# Patient Record
Sex: Male | Born: 1945 | Race: White | Hispanic: No | Marital: Married | Smoking: Former smoker
Health system: Southern US, Community
[De-identification: ages and names within clinical notes are randomized; demographics above are authoritative.]

## PROBLEM LIST (undated history)

## (undated) DIAGNOSIS — C61 Malignant neoplasm of prostate: Secondary | ICD-10-CM

## (undated) DIAGNOSIS — R7303 Prediabetes: Secondary | ICD-10-CM

## (undated) HISTORY — PX: SKIN CANCER EXCISION: SHX779

## (undated) HISTORY — PX: COLONOSCOPY: SHX174

## (undated) HISTORY — PX: FLEXIBLE SIGMOIDOSCOPY: SHX1649

## (undated) HISTORY — DX: Malignant neoplasm of prostate: C61

## (undated) HISTORY — PX: OTHER SURGICAL HISTORY: SHX169

---

## 2010-08-28 ENCOUNTER — Ambulatory Visit: Payer: Self-pay | Admitting: Gastroenterology

## 2011-12-16 DIAGNOSIS — N400 Enlarged prostate without lower urinary tract symptoms: Secondary | ICD-10-CM | POA: Insufficient documentation

## 2011-12-16 DIAGNOSIS — R972 Elevated prostate specific antigen [PSA]: Secondary | ICD-10-CM | POA: Insufficient documentation

## 2012-01-06 ENCOUNTER — Ambulatory Visit: Payer: Self-pay | Admitting: Urology

## 2012-01-07 LAB — URINE CULTURE

## 2012-01-19 ENCOUNTER — Ambulatory Visit: Payer: Self-pay | Admitting: Urology

## 2012-01-28 LAB — PATHOLOGY REPORT

## 2013-10-09 DIAGNOSIS — Z87898 Personal history of other specified conditions: Secondary | ICD-10-CM | POA: Insufficient documentation

## 2013-10-09 DIAGNOSIS — R7309 Other abnormal glucose: Secondary | ICD-10-CM | POA: Insufficient documentation

## 2013-10-09 DIAGNOSIS — J309 Allergic rhinitis, unspecified: Secondary | ICD-10-CM | POA: Insufficient documentation

## 2013-10-09 DIAGNOSIS — IMO0002 Reserved for concepts with insufficient information to code with codable children: Secondary | ICD-10-CM | POA: Insufficient documentation

## 2015-09-24 DIAGNOSIS — F5101 Primary insomnia: Secondary | ICD-10-CM | POA: Insufficient documentation

## 2016-03-26 DIAGNOSIS — Z7185 Encounter for immunization safety counseling: Secondary | ICD-10-CM | POA: Insufficient documentation

## 2016-03-26 DIAGNOSIS — Z0000 Encounter for general adult medical examination without abnormal findings: Secondary | ICD-10-CM | POA: Insufficient documentation

## 2016-03-26 DIAGNOSIS — Z7189 Other specified counseling: Secondary | ICD-10-CM | POA: Insufficient documentation

## 2016-06-10 DIAGNOSIS — C44310 Basal cell carcinoma of skin of unspecified parts of face: Secondary | ICD-10-CM | POA: Insufficient documentation

## 2016-09-10 ENCOUNTER — Encounter: Payer: Self-pay | Admitting: *Deleted

## 2016-09-13 ENCOUNTER — Encounter
Admission: RE | Disposition: A | Discharge status: Home / Self Care | Payer: Self-pay | Source: Ambulatory Visit | Attending: Gastroenterology

## 2016-09-13 ENCOUNTER — Ambulatory Visit: Payer: Medicare Other | Admitting: Anesthesiology

## 2016-09-13 ENCOUNTER — Ambulatory Visit
Admission: RE | Admit: 2016-09-13 | Discharge: 2016-09-13 | Disposition: A | Discharge status: Home / Self Care | Payer: Medicare Other | Source: Ambulatory Visit | Attending: Gastroenterology | Admitting: Gastroenterology

## 2016-09-13 DIAGNOSIS — D127 Benign neoplasm of rectosigmoid junction: Secondary | ICD-10-CM | POA: Diagnosis not present

## 2016-09-13 DIAGNOSIS — D120 Benign neoplasm of cecum: Secondary | ICD-10-CM | POA: Diagnosis not present

## 2016-09-13 DIAGNOSIS — Z86010 Personal history of colonic polyps: Secondary | ICD-10-CM | POA: Diagnosis present

## 2016-09-13 DIAGNOSIS — D122 Benign neoplasm of ascending colon: Secondary | ICD-10-CM | POA: Diagnosis not present

## 2016-09-13 DIAGNOSIS — R7303 Prediabetes: Secondary | ICD-10-CM | POA: Insufficient documentation

## 2016-09-13 DIAGNOSIS — Z79899 Other long term (current) drug therapy: Secondary | ICD-10-CM | POA: Insufficient documentation

## 2016-09-13 HISTORY — PX: COLONOSCOPY WITH PROPOFOL: SHX5780

## 2016-09-13 HISTORY — DX: Prediabetes: R73.03

## 2016-09-13 SURGERY — COLONOSCOPY WITH PROPOFOL
Anesthesia: General

## 2016-09-13 MED ORDER — LIDOCAINE HCL (PF) 1 % IJ SOLN
2.0000 mL | Freq: Once | INTRAMUSCULAR | Status: AC
  Administered 2016-09-13: 0.3 mL via INTRADERMAL

## 2016-09-13 MED ORDER — LIDOCAINE HCL (PF) 1 % IJ SOLN
INTRAMUSCULAR | Status: AC
  Administered 2016-09-13: 0.3 mL via INTRADERMAL
  Filled 2016-09-13: qty 2

## 2016-09-13 MED ORDER — PROPOFOL 500 MG/50ML IV EMUL
INTRAVENOUS | Status: DC | PRN
  Administered 2016-09-13: 150 ug/kg/min via INTRAVENOUS

## 2016-09-13 MED ORDER — PROPOFOL 500 MG/50ML IV EMUL
INTRAVENOUS | Status: AC
  Filled 2016-09-13: qty 50

## 2016-09-13 MED ORDER — SODIUM CHLORIDE 0.9 % IV SOLN: INTRAVENOUS | Status: DC

## 2016-09-13 MED ORDER — SODIUM CHLORIDE 0.9 % IV SOLN
INTRAVENOUS | Status: DC
  Administered 2016-09-13: 08:00:00 via INTRAVENOUS
  Administered 2016-09-13: 1000 mL via INTRAVENOUS

## 2016-09-13 NOTE — Op Note (Signed)
[facility] Gastroenterology Patient Name: Daniel Ryan Procedure Date: 09/13/2016 7:31 AM MRN: [id number] Account #: [id number] Date of Birth: 1946/01/27 Admit Type: Outpatient Age: 71 Room: [facility] ENDO ROOM 1 Gender: Male Note Status: Finalized Procedure:            Colonoscopy Indications:          Personal history of colonic polyps Providers:            Lollie Sails, MD Referring MD:         Dion Body (Referring MD) Medicines:            Monitored Anesthesia Care Complications:        No immediate complications. Procedure:            Pre-Anesthesia Assessment:                       - ASA Grade Assessment: II - A patient with mild                        systemic disease.                       After obtaining informed consent, the colonoscope was                        passed under direct vision. Throughout the procedure,                        the patient's blood pressure, pulse, and oxygen                        saturations were monitored continuously. The                        Colonoscope was introduced through the anus and                        advanced to the the cecum, identified by appendiceal                        orifice and ileocecal valve. The colonoscopy was                        performed with moderate difficulty. Successful                        completion of the procedure was aided by changing the                        patient to a supine position, changing the patient to a                        prone position and using manual pressure. The patient                        tolerated the procedure well. The quality of the bowel                        preparation was good. Findings:      A 4 mm polyp was found in the recto-sigmoid colon. The  polyp was       sessile. The polyp was removed with a cold snare. Resection and       retrieval were complete.      Two sessile polyps were found in the proximal ascending colon. The   polyps were 3 to 4 mm in size. These polyps were removed with a cold       snare. Resection and retrieval were complete.      Four sessile polyps were found in the distal ascending colon. The polyps       were 2 to 3 mm in size. These polyps were removed with a cold biopsy       forceps. Resection and retrieval were complete.      A 14 mm polyp was found in the cecum. The polyp was sessile. Polypectomy       was attempted, initially using a cold snare. Polyp resection was       incomplete with this device. This intervention then required a different       device and polypectomy technique. The polyp was removed with a cold       biopsy forceps. Resection and retrieval were complete.      The retroflexed view of the distal rectum and anal verge was normal and       showed no anal or rectal abnormalities.      The digital rectal exam was normal. Impression:           - One 4 mm polyp at the recto-sigmoid colon, removed                        with a cold snare. Resected and retrieved.                       - Two 3 to 4 mm polyps in the proximal ascending colon,                        removed with a cold snare. Resected and retrieved.                       - Four 2 to 3 mm polyps in the distal ascending colon,                        removed with a cold biopsy forceps. Resected and                        retrieved.                       - One 14 mm polyp in the cecum, removed with a cold                        biopsy forceps. Resected and retrieved.                       - The distal rectum and anal verge are normal on                        retroflexion view. Recommendation:       - Await pathology results.                       -  Full liquid diet today, then advance as tolerated to                        low residue diet for 2 days. Procedure Code(s):    --- Professional ---                       [phone], Colonoscopy, flexible; with removal of tumor(s),                        polyp(s), or  other lesion(s) by snare technique                       45380, 69, Colonoscopy, flexible; with biopsy, single                        or multiple Diagnosis Code(s):    --- Professional ---                       D12.7, Benign neoplasm of rectosigmoid junction                       D12.0, Benign neoplasm of cecum                       D12.2, Benign neoplasm of ascending colon                       Z86.010, Personal history of colonic polyps CPT copyright 2016 American Medical Association. All rights reserved. The codes documented in this report are preliminary and upon coder review may  be revised to meet current compliance requirements. Lollie Sails, MD 09/13/2016 9:40:58 AM This report has been signed electronically. Number of Addenda: 0 Note Initiated On: 09/13/2016 7:31 AM Scope Withdrawal Time: 0 hours 44 minutes 2 seconds  Total Procedure Duration: 0 hours 58 minutes 13 seconds       [facility]

## 2016-09-13 NOTE — Transfer of Care (Signed)
Immediate Anesthesia Transfer of Care Note  Patient: Daniel Ryan  Procedure(s) Performed: Procedure(s): COLONOSCOPY WITH PROPOFOL (N/A)  Patient Location: PACU  Anesthesia Type:General  Level of Consciousness: sedated  Airway & Oxygen Therapy: Patient Spontanous Breathing and Patient connected to nasal cannula oxygen  Post-op Assessment: Report given to RN and Post -op Vital signs reviewed and stable  Post vital signs: Reviewed and stable  Last Vitals:  Vitals:   09/13/16 0747 09/13/16 0921  BP: (!) 146/82 110/69  Pulse: 76 68  Resp: 17 10  Temp: 36.5 C (!) 36.1 C    Last Pain:  Vitals:   09/13/16 0921  TempSrc: Tympanic         Complications: No apparent anesthesia complications

## 2016-09-13 NOTE — Anesthesia Postprocedure Evaluation (Signed)
Anesthesia Post Note  Patient: Daniel Ryan  Procedure(s) Performed: Procedure(s) (LRB): COLONOSCOPY WITH PROPOFOL (N/A)  Patient location during evaluation: Endoscopy Anesthesia Type: General Level of consciousness: awake and alert and oriented Pain management: pain level controlled Vital Signs Assessment: post-procedure vital signs reviewed and stable Respiratory status: spontaneous breathing, nonlabored ventilation and respiratory function stable Cardiovascular status: blood pressure returned to baseline and stable Postop Assessment: no signs of nausea or vomiting Anesthetic complications: no     Last Vitals:  Vitals:   09/13/16 0747 09/13/16 0921  BP: (!) 146/82 110/69  Pulse: 76 68  Resp: 17 10  Temp: 36.5 C (!) 36.1 C    Last Pain:  Vitals:   09/13/16 0921  TempSrc: Tympanic                 Morena Mckissack

## 2016-09-13 NOTE — Anesthesia Post-op Follow-up Note (Cosign Needed)
Anesthesia QCDR form completed.        

## 2016-09-13 NOTE — Anesthesia Procedure Notes (Signed)
Date/Time: 09/13/2016 8:20 AM Performed by: Nelda Marseille Pre-anesthesia Checklist: Patient identified, Emergency Drugs available, Suction available, Patient being monitored and Timeout performed Oxygen Delivery Method: Nasal cannula

## 2016-09-13 NOTE — Anesthesia Preprocedure Evaluation (Signed)
Anesthesia Evaluation  Patient identified by MRN, date of birth, ID band Patient awake    Reviewed: Allergy & Precautions, NPO status , Patient's Chart, lab work & pertinent test results  History of Anesthesia Complications Negative for: history of anesthetic complications  Airway Mallampati: II  TM Distance: >3 FB Neck ROM: Full    Dental  (+) Partial Upper, Partial Lower   Pulmonary neg pulmonary ROS, neg sleep apnea, neg COPD,    breath sounds clear to auscultation- rhonchi (-) wheezing      Cardiovascular Exercise Tolerance: Good (-) hypertension(-) CAD and (-) Past MI  Rhythm:Regular Rate:Normal - Systolic murmurs and - Diastolic murmurs    Neuro/Psych negative neurological ROS  negative psych ROS   GI/Hepatic negative GI ROS, Neg liver ROS,   Endo/Other  negative endocrine ROSneg diabetes  Renal/GU negative Renal ROS     Musculoskeletal negative musculoskeletal ROS (+)   Abdominal (+) + obese,   Peds  Hematology negative hematology ROS (+)   Anesthesia Other Findings Past Medical History: No date: Borderline diabetes   Reproductive/Obstetrics                             Anesthesia Physical Anesthesia Plan  ASA: II  Anesthesia Plan: General   Post-op Pain Management:    Induction: Intravenous  PONV Risk Score and Plan: 1 and Propofol infusion  Airway Management Planned: Natural Airway  Additional Equipment:   Intra-op Plan:   Post-operative Plan:   Informed Consent: I have reviewed the patients History and Physical, chart, labs and discussed the procedure including the risks, benefits and alternatives for the proposed anesthesia with the patient or authorized representative who has indicated his/her understanding and acceptance.   Dental advisory given  Plan Discussed with: CRNA and Anesthesiologist  Anesthesia Plan Comments:         Anesthesia Quick  Evaluation

## 2016-09-13 NOTE — H&P (Signed)
Outpatient short stay form Pre-procedure 09/13/2016 8:08 AM Lollie Sails MD  Primary Physician: Dr Dion Body  Reason for visit:  Colonoscopy  History of present illness:  Patient is a 71 year old male presenting today as above. Has a personal history of adenomatous colon polyps. He takes no aspirin or blood thinning agents. He tolerated his prep well.    Current Facility-Administered Medications:  .  0.9 %  sodium chloride infusion, , Intravenous, Continuous, Lollie Sails, MD, Last Rate: 20 mL/hr at 09/13/16 0801, 1,000 mL at 09/13/16 0801 .  0.9 %  sodium chloride infusion, , Intravenous, Continuous, Lollie Sails, MD  Prescriptions Prior to Admission  Medication Sig Dispense Refill Last Dose  . clobetasol cream (TEMOVATE) 2.54 % Apply 1 application topically 2 (two) times daily.     . finasteride (PROSCAR) 5 MG tablet Take 5 mg by mouth daily.     Marland Kitchen loratadine (CLARITIN) 10 MG tablet Take 10 mg by mouth daily.     . meloxicam (MOBIC) 15 MG tablet Take 15 mg by mouth daily.     . Multiple Vitamin (MULTIVITAMIN) tablet Take 1 tablet by mouth daily.     . naproxen sodium (ANAPROX) 220 MG tablet Take 220 mg by mouth 2 (two) times daily with a meal.     . traZODone (DESYREL) 50 MG tablet Take 50 mg by mouth at bedtime.        No Known Allergies   Past Medical History:  Diagnosis Date  . Borderline diabetes     Review of systems:      Physical Exam    Heart and lungs: Regular rate and rhythm without rub or gallop, lungs are bilaterally clear.    HEENT: Normocephalic atraumatic eyes are anicteric    Other:     Pertinant exam for procedure: Soft nontender nondistended bowel sounds positive normoactive.    Planned proceedures: Colonoscopy and indicated procedures. I have discussed the risks benefits and complications of procedures to include not limited to bleeding, infection, perforation and the risk of sedation and the patient wishes to  proceed.    Lollie Sails, MD Gastroenterology 09/13/2016  8:08 AM

## 2016-09-14 ENCOUNTER — Encounter: Payer: Self-pay | Admitting: Gastroenterology

## 2016-09-14 LAB — SURGICAL PATHOLOGY

## 2017-03-23 ENCOUNTER — Ambulatory Visit (INDEPENDENT_AMBULATORY_CARE_PROVIDER_SITE_OTHER): Payer: Medicare Other | Admitting: Urology

## 2017-03-23 ENCOUNTER — Encounter: Payer: Self-pay | Admitting: Urology

## 2017-03-23 VITALS — BP 156/84 | HR 76 | Ht 70.0 in | Wt 241.8 lb

## 2017-03-23 DIAGNOSIS — R972 Elevated prostate specific antigen [PSA]: Secondary | ICD-10-CM

## 2017-03-23 MED ORDER — FINASTERIDE 5 MG PO TABS: 5.0000 mg | ORAL_TABLET | Freq: Every day | ORAL | 3 refills | Status: DC

## 2017-03-23 NOTE — Progress Notes (Signed)
03/23/2017 2:19 PM   Daniel Ryan Dec 08, 1945 [id number]  Referring provider: Dion Body, MD [facility] [place], [place] [address]  Chief Complaint  Patient presents with  . Elevated PSA    HPI: 72 year old male followed for an elevated PSA.  Previous biopsies performed in October 2009, December 2010 and February 2012 for PSA levels of 9.5, 13 and 20 respectively all with benign pathology.  A urine PCA3 in July 2013 was 21.  Repeat PSA November 2013 was 29.9 and he underwent transrectal saturation biopsies in December 2013.  Path showed 1 core from the right lateral base with high-grade PIN.  The pathology was sent to [facility] for a second opinion with concurrence.  Subsequent PSA levels were stable in the upper 20/low 30 range.  A PSA January 2016 increased to 36.7 and a prostate MRI was performed which showed no findings suggestive of high-grade malignancy.  He did have PI all the right with else's new PIRADS 2 lesions felt more consistent with inflammation.  Due to the number of previous biopsies he elected not to repeat his biopsy and is currently on surveillance.  He was started on finasteride March 2016.  Last PSA August 2018 was 17.1.  He has no voiding complaints.   PMH: Past Medical History:  Diagnosis Date  . Borderline diabetes     Surgical History: Past Surgical History:  Procedure Laterality Date  . COLONOSCOPY    . COLONOSCOPY WITH PROPOFOL N/A 09/13/2016   Procedure: COLONOSCOPY WITH PROPOFOL;  Surgeon: Lollie Sails, MD;  Location[facility] END[facility]COPY;  Service: Endoscopy;  Laterality: N/A;  . FLEXIBLE SIGMOIDOSCOPY    . prostate biopsies      Home Medications:  Allergies as of 03/23/2017   No Known Allergies     Medication List        Accurate as of 03/23/17  2:19 PM. Always use your most recent med list.          clobetasol cream 0.05 % Commonly known as:  TEMOVATE Apply 1 application topically 2 (two) times  daily.   finasteride 5 MG tablet Commonly known as:  PROSCAR Take 5 mg by mouth daily.   loratadine 10 MG tablet Commonly known as:  CLARITIN Take 10 mg by mouth daily.   multivitamin tablet Take 1 tablet by mouth daily.   traZODone 50 MG tablet Commonly known as:  DESYREL Take 50 mg by mouth at bedtime.       Allergies: No Known Allergies  Family History: Family History  Problem Relation Age of Onset  . Bladder Cancer Neg Hx   . Kidney cancer Neg Hx   . Prostate cancer Neg Hx     Social History:  reports that he has quit smoking. he has never used smokeless tobacco. He reports that he drinks alcohol. He reports that he does not use drugs.  ROS: UROLOGY Frequent Urination?: No Hard to postpone urination?: No Burning/pain with urination?: No Get up at night to urinate?: No Leakage of urine?: No Urine stream starts and stops?: No Trouble starting stream?: No Do you have to strain to urinate?: No Blood in urine?: No Urinary tract infection?: No Sexually transmitted disease?: No Injury to kidneys or bladder?: No Painful intercourse?: No Weak stream?: No Erection problems?: No Penile pain?: No  Gastrointestinal Nausea?: No Vomiting?: No Indigestion/heartburn?: No Diarrhea?: No Constipation?: No  Constitutional Fever: No Night sweats?: No Weight loss?: No Fatigue?: No  Skin Skin rash/lesions?: No  Itching?: No  Eyes Blurred vision?: No Double vision?: No  Ears/Nose/Throat Sore throat?: No Sinus problems?: No  Hematologic/Lymphatic Swollen glands?: No Easy bruising?: Yes  Cardiovascular Leg swelling?: No Chest pain?: No  Respiratory Cough?: No Shortness of breath?: No  Endocrine Excessive thirst?: No  Musculoskeletal Back pain?: No Joint pain?: Yes  Neurological Headaches?: No Dizziness?: No  Psychologic Depression?: No Anxiety?: No  Physical Exam: BP (!) 156/84 (BP Location: Right Arm, Patient Position: Sitting, Cuff  Size: Normal)   Pulse 76   Ht 5\' 10"  (1.778 m)   Wt 241 lb 12.8 oz (109.7 kg)   BMI 34.69 kg/m   Constitutional:  Alert and oriented, No acute distress. HEENT: [place] AT, moist mucus membranes.  Trachea midline, no masses. Cardiovascular: No clubbing, cyanosis, or edema. Respiratory: Normal respiratory effort, no increased work of breathing. GI: Abdomen is soft, nontender, nondistended, no abdominal masses GU: No CVA tenderness.  Prostate 40 g, smooth without nodules Skin: No rashes, bruises or suspicious lesions. Lymph: No cervical or inguinal adenopathy. Neurologic: Grossly intact, no focal deficits, moving all 4 extremities. Psychiatric: Normal mood and affect.   Assessment & Plan:   Unless there are significant changes he desires to continue active surveillance.  PSA was drawn today and if stable will repeat his PSA in 6 months and PSA/DRE in 1 year.  Finasteride was refilled.   1. Elevated PSA  - PSA   Abbie Sons, MD  [facility] [address] [place], [place] [address] [phone]

## 2017-03-24 LAB — PSA: PROSTATE SPECIFIC AG, SERUM: 28.4 ng/mL — AB (ref 0.0–4.0)

## 2017-03-28 ENCOUNTER — Telehealth: Payer: Self-pay

## 2017-03-28 DIAGNOSIS — R972 Elevated prostate specific antigen [PSA]: Secondary | ICD-10-CM

## 2017-03-28 NOTE — Telephone Encounter (Signed)
-----   Message from Abbie Sons, MD sent at 03/25/2017  9:32 AM EST ----- PSA is 28.4 which is an equivalent PSA of 56.8 when correcting for finasteride.  Would recommend a repeat prostate MRI.  If he is in agreement I will put an order for scheduling.  His last MRI was at [facility] and would be better to do there so they can compare with his previous one.

## 2017-03-28 NOTE — Telephone Encounter (Signed)
Patient notified he reluctantly agrees to MRI, will not go ahead if it is not the open MRI. Is this an option for [facility]? If so please schedule and discuss pre-instructions thanks

## 2017-04-05 NOTE — Telephone Encounter (Signed)
I called [facility] radiology department and they said that they do not have an open MRI there. The patient said he was ok going to [facility]. I just need an order put in for the MRI.   Sharyn Lull

## 2017-04-05 NOTE — Telephone Encounter (Signed)
Called left message for pt to cb Mailing instructions to the patient   Daniel Ryan

## 2017-05-17 ENCOUNTER — Ambulatory Visit
Admission: RE | Admit: 2017-05-17 | Discharge: 2017-05-17 | Disposition: A | Discharge status: Home / Self Care | Payer: Medicare Other | Source: Ambulatory Visit | Attending: Urology | Admitting: Urology

## 2017-05-17 DIAGNOSIS — R972 Elevated prostate specific antigen [PSA]: Secondary | ICD-10-CM

## 2017-05-17 MED ORDER — GADOBENATE DIMEGLUMINE 529 MG/ML IV SOLN
20.0000 mL | Freq: Once | INTRAVENOUS | Status: AC | PRN
  Administered 2017-05-17: 20 mL via INTRAVENOUS

## 2017-05-25 ENCOUNTER — Telehealth: Payer: Self-pay | Admitting: Urology

## 2017-05-25 NOTE — Telephone Encounter (Signed)
-----   Message from Abbie Sons, MD sent at 05/20/2017 10:35 AM EDT ----- Please schedule MR fusion biopsy at [facility].  He does not want to schedule for at least 6 weeks

## 2017-05-25 NOTE — Telephone Encounter (Signed)
Faxed referral over to [facility] and they will contact the patient with the app  Sharyn Lull

## 2017-07-04 ENCOUNTER — Other Ambulatory Visit: Payer: Self-pay | Admitting: Urology

## 2017-07-16 DIAGNOSIS — C61 Malignant neoplasm of prostate: Secondary | ICD-10-CM

## 2017-07-16 HISTORY — DX: Malignant neoplasm of prostate: C61

## 2017-07-18 ENCOUNTER — Ambulatory Visit (INDEPENDENT_AMBULATORY_CARE_PROVIDER_SITE_OTHER): Payer: Medicare Other | Admitting: Urology

## 2017-07-18 ENCOUNTER — Encounter: Payer: Self-pay | Admitting: Urology

## 2017-07-18 VITALS — BP 153/74 | HR 89 | Ht 70.0 in | Wt 240.0 lb

## 2017-07-18 DIAGNOSIS — C61 Malignant neoplasm of prostate: Secondary | ICD-10-CM | POA: Diagnosis not present

## 2017-07-19 ENCOUNTER — Encounter: Payer: Self-pay | Admitting: Urology

## 2017-07-19 NOTE — Progress Notes (Signed)
07/18/2017 6:50 AM   Tomma Rakers 1946-01-11 [id number]  Referring provider: Dion Body, MD [facility] [place], [place] [address]  Chief Complaint  Patient presents with  . Follow-up    HPI: 72 year old male presents for prostate biopsy follow-up.  He [facility] at [facility] [place] on 06/30/2017 for a PSA of 28.4 and a 2 cm anterior PI-RADS 3 lesion on MRI.  He had no post biopsy problems.  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  He states he was contacted by Dr. Tresa Moore and management options were briefly discussed.  PMH: Past Medical History:  Diagnosis Date  . Borderline diabetes     Surgical History: Past Surgical History:  Procedure Laterality Date  . COLONOSCOPY    . COLONOSCOPY WITH PROPOFOL N/A 09/13/2016   Procedure: COLONOSCOPY WITH PROPOFOL;  Surgeon: Lollie Sails, MD;  Location: [facility] ENDOSCOPY;  Service: Endoscopy;  Laterality: N/A;  . FLEXIBLE SIGMOIDOSCOPY    . prostate biopsies      Home Medications:  Allergies as of 07/18/2017   No Known Allergies     Medication List        Accurate as of 07/18/17 11:59 PM. Always use your most recent med list.          clobetasol cream 0.05 % Commonly known as:  TEMOVATE Apply 1 application topically 2 (two) times daily.   finasteride 5 MG tablet Commonly known as:  PROSCAR Take 1 tablet (5 mg total) by mouth daily.   loratadine 10 MG tablet Commonly known as:  CLARITIN Take 10 mg by mouth daily.   multivitamin tablet Take 1 tablet by mouth daily.   traZODone 50 MG tablet Commonly known as:  DESYREL Take 50 mg by mouth at bedtime.       Allergies: No Known Allergies  Family History: Family History  Problem Relation Age of Onset  . Bladder Cancer Neg Hx   . Kidney cancer Neg Hx   . Prostate cancer Neg Hx      Social History:  reports that he has quit smoking. He has never used smokeless tobacco. He reports that he drinks alcohol. He reports that he does not use drugs.  ROS: UROLOGY Frequent Urination?: No Hard to postpone urination?: No Burning/pain with urination?: No Get up at night to urinate?: No Leakage of urine?: No Urine stream starts and stops?: No Trouble starting stream?: No Do you have to strain to urinate?: No Blood in urine?: No Urinary tract infection?: No Sexually transmitted disease?: No Injury to kidneys or bladder?: No Painful intercourse?: No Weak stream?: No Erection problems?: No Penile pain?: No  Gastrointestinal Nausea?: No Vomiting?: No Indigestion/heartburn?: No Diarrhea?: No Constipation?: No  Constitutional Fever: No Night sweats?: No Weight loss?: No Fatigue?: No  Skin Skin rash/lesions?: No Itching?: No  Eyes Blurred vision?: No Double vision?: No  Ears/Nose/Throat Sinus problems?: No  Hematologic/Lymphatic Swollen glands?: No Easy bruising?: No  Cardiovascular Leg swelling?: No Chest pain?: No  Respiratory Cough?: No Shortness of breath?: No  Endocrine Excessive thirst?: No  Musculoskeletal Back pain?: No Joint pain?: No  Neurological Headaches?: No Dizziness?: No  Psychologic Depression?: No Anxiety?: No  Physical Exam: BP (!) 153/74 (BP Location: Left Arm, Patient Position: Sitting, Cuff Size: Large)   Pulse 89   Ht 5\' 10"  (1.778 m)   Wt 240 lb (108.9  kg)   BMI 34.44 kg/m   Constitutional:  Alert and oriented, No acute distress.   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  His prostate MRI did not show evidence of lymphadenopathy or extracapsular extension.  Based on his PSA I have recommended a baseline bone scan.  Curative treatment options were discussed including radical prostatectomy  and radiation modalities.  Recent approval of HIFU was also discussed.  Active surveillance was discussed and he was informed this typically is not done for intermediate risk disease except in selected cases.  He is interested and surveillance and will send his pathology for genetic testing.  He will be notified with his bone scan results.  Greater than 50% of this 15-minute visit was spent counseling the patient.   Abbie Sons, [place] [address] [place], [place] [address] [phone]

## 2017-08-05 ENCOUNTER — Ambulatory Visit: Payer: Medicare Other | Attending: Urology

## 2017-08-05 ENCOUNTER — Telehealth: Payer: Self-pay | Admitting: Urology

## 2017-08-05 NOTE — Telephone Encounter (Signed)
Got a call from NM that he now showed for his bone scan. Called and left him a message to call back.  Just Daniel Ryan

## 2017-08-10 NOTE — Telephone Encounter (Signed)
Please send letter to patient to schedule a follow-up visit.

## 2017-08-10 NOTE — Telephone Encounter (Signed)
Patient called back and spoke with Angie and she let him know that we would have scheduling call him to reschd. We have emailed scheduling to call the patient. Patient stated that he was not told where to go to have to study done.   Sharyn Lull

## 2017-08-10 NOTE — Telephone Encounter (Signed)
Noted  

## 2017-08-19 ENCOUNTER — Encounter
Admission: RE | Admit: 2017-08-19 | Discharge: 2017-08-19 | Disposition: A | Discharge status: Home / Self Care | Payer: Medicare Other | Source: Ambulatory Visit | Attending: Urology | Admitting: Urology

## 2017-08-19 DIAGNOSIS — R948 Abnormal results of function studies of other organs and systems: Secondary | ICD-10-CM | POA: Insufficient documentation

## 2017-08-19 DIAGNOSIS — C61 Malignant neoplasm of prostate: Secondary | ICD-10-CM | POA: Diagnosis not present

## 2017-08-19 MED ORDER — TECHNETIUM TC 99M MEDRONATE IV KIT
22.3600 | PACK | Freq: Once | INTRAVENOUS | Status: AC | PRN
  Administered 2017-08-19: 22.36 via INTRAVENOUS

## 2017-08-26 ENCOUNTER — Telehealth: Payer: Self-pay

## 2017-08-26 ENCOUNTER — Other Ambulatory Visit: Payer: Self-pay | Admitting: Urology

## 2017-08-26 DIAGNOSIS — C61 Malignant neoplasm of prostate: Secondary | ICD-10-CM

## 2017-08-26 NOTE — Telephone Encounter (Signed)
-----   Message from Abbie Sons, MD sent at 08/26/2017 10:26 AM EDT ----- Bone scan did show multiple areas of slightly increased activity in the spine, ribs and left lower extremity.  These are unlikely related to prostate cancer however radiology recommended obtaining x-rays for further evaluation.  I have entered the orders.  These do not need to be scheduled and he can walk-in at his convenience in the medical mall.  Will call with results.

## 2017-08-26 NOTE — Telephone Encounter (Signed)
Called pt informed him of the information below. Pt gave verbal understanding however declines getting x-rays done at this time. Pt states that he would like to talk to his PCP before proceeding.

## 2017-09-23 ENCOUNTER — Other Ambulatory Visit: Payer: Medicare Other

## 2017-10-09 DIAGNOSIS — C61 Malignant neoplasm of prostate: Secondary | ICD-10-CM | POA: Insufficient documentation

## 2017-10-09 NOTE — Progress Notes (Signed)
[place]  [phone] Fax:(336) [phone]  ID: Daniel Ryan OB: [birth year]  MR#: [id number]  UKG#:[id number]  Patient Care Team: Dion Body, MD as PCP - General (Family Medicine)  CHIEF COMPLAINT: Prostate cancer/high-grade PIN  INTERVAL HISTORY: Patient is a 72 year old male who is referred for continued evaluation and monitoring of his known prostate cancer/high-grade PIN.  Patient continues to have a persistently elevated PSA, therefore clinical suspicion is high of prostate cancer.  He has had multiple biopsies which have only revealed high-grade PIN.  He currently feels well and is asymptomatic.  He has no neurologic complaints.  He denies any pain.  He has a good appetite and denies weight loss.  He has no chest pain or shortness of breath.  He denies any nausea, vomiting, constipation, or diarrhea.  He has no urinary complaints.  Patient feels at his baseline offers no specific complaints today.  REVIEW OF SYSTEMS:   Review of Systems  Constitutional: Negative.  Negative for fever, malaise/fatigue and weight loss.  Respiratory: Negative.  Negative for cough and shortness of breath.   Cardiovascular: Negative.  Negative for chest pain and leg swelling.  Gastrointestinal: Negative.  Negative for abdominal pain.  Genitourinary: Negative.  Negative for dysuria, frequency and hematuria.  Musculoskeletal: Negative.  Negative for back pain.  Skin: Negative.  Negative for rash.  Neurological: Negative.  Negative for sensory change, focal weakness, weakness and headaches.  Psychiatric/Behavioral: Negative.  The patient is not nervous/anxious.     As per HPI. Otherwise, a complete review of systems is negative.  PAST MEDICAL HISTORY: Past Medical History:  Diagnosis Date  . Borderline diabetes   . Prostate cancer ([place]) 07/2017    PAST SURGICAL HISTORY: Past Surgical History:  Procedure Laterality Date  . COLONOSCOPY    . COLONOSCOPY  WITH PROPOFOL N/A 09/13/2016   Procedure: COLONOSCOPY WITH PROPOFOL;  Surgeon: Lollie Sails, MD;  Location: [facility] ENDOSCOPY;  Service: Endoscopy;  Laterality: N/A;  . FLEXIBLE SIGMOIDOSCOPY    . prostate biopsies    . SKIN CANCER EXCISION     dr dasher    FAMILY HISTORY: Family History  Problem Relation Age of Onset  . Hypertension Mother   . Heart attack Father   . Lung cancer Father   . Diabetes Brother   . Bladder Cancer Neg Hx   . Kidney cancer Neg Hx   . Prostate cancer Neg Hx     ADVANCED DIRECTIVES (Y/N):  N  HEALTH MAINTENANCE: Social History   Tobacco Use  . Smoking status: Former Smoker    Packs/day: 2.00    Years: 20.00    Pack years: 40.00    Types: Cigarettes    Last attempt to quit: 10/12/1981    Years since quitting: 36.0  . Smokeless tobacco: Never Used  Substance Use Topics  . Alcohol use: Yes    Alcohol/week: 1.0 - 6.0 standard drinks    Types: 1 - 6 Cans of beer per week    Frequency: Never    Comment: 1-6 per day -varies  . Drug use: No     Colonoscopy:  PAP:  Bone density:  Lipid panel:  No Known Allergies  Current Outpatient Medications  Medication Sig Dispense Refill  . finasteride (PROSCAR) 5 MG tablet Take 1 tablet (5 mg total) by mouth daily. 90 tablet 3  . loratadine (CLARITIN) 10 MG tablet Take 10 mg by mouth daily.    . traZODone (DESYREL) 50 MG tablet Take 50 mg  by mouth at bedtime.    . clobetasol cream (TEMOVATE) 6.16 % Apply 1 application topically 2 (two) times daily.    . hydrocortisone (ANUSOL-HC) 2.5 % rectal cream Apply twice daily for 7 days    . Multiple Vitamin (MULTIVITAMIN) tablet Take 1 tablet by mouth daily.    . naproxen sodium (ALEVE) 220 MG tablet Take 220 mg by mouth daily as needed.     No current facility-administered medications for this visit.     OBJECTIVE: Vitals:   10/12/17 1521  BP: (!) 148/74  Pulse: 80  Resp: 18  Temp: 98.3 F (36.8 C)     Body mass index is 33.52 kg/m.    ECOG FS:0 -  Asymptomatic  General: Well-developed, well-nourished, no acute distress. Eyes: Pink conjunctiva, anicteric sclera. HEENT: Normocephalic, moist mucous membranes, clear oropharnyx. Lungs: Clear to auscultation bilaterally. Heart: Regular rate and rhythm. No rubs, murmurs, or gallops. Abdomen: Soft, nontender, nondistended. No organomegaly noted, normoactive bowel sounds. Musculoskeletal: No edema, cyanosis, or clubbing. Neuro: Alert, answering all questions appropriately. Cranial nerves grossly intact. Skin: No rashes or petechiae noted. Psych: Normal affect. Lymphatics: No cervical, calvicular, axillary or inguinal LAD.   LAB RESULTS:  No results found for: NA, K, CL, CO2, GLUCOSE, BUN, CREATININE, CALCIUM, PROT, ALBUMIN, AST, ALT, ALKPHOS, BILITOT, GFRNONAA, GFRAA  No results found for: WBC, NEUTROABS, HGB, HCT, MCV, PLT   STUDIES: No results found.  ASSESSMENT: Prostate cancer/high-grade PIN  PLAN:    1. Prostate cancer/high-grade PIN: Patient's PSA is 20.74 today which is approximately his baseline.  Nuclear medicine bone scan completed on August 19, 2017 revealed increased activity over the lower thoracic and mid lumbar spine.  These are nonspecific, although could be representative of metastatic disease.  Patient has had multiple prostate biopsies in October 2009, December 2010, and February 2012.  All biopsies returned benign pathology.  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 Pathology was sent to [facility] for second opinion who concurred with the diagnosis.  His PSA has remained stable in the 20-30 range.  Prostate MRI in March 2016 revealed no findings suggestive of malignancy.  Patient has declined any further biopsies.  Given the stability of his PSA and no obvious evidence of malignancy, will continue to pursue active surveillance.  No further imaging or biopsies are necessary unless there is suspicion of progression of disease.  Return to clinic in 3  months with repeat laboratory work and further evaluation.  I spent a total of 60 minutes face-to-face with the patient of which greater than 50% of the visit was spent in counseling and coordination of care as detailed above.  Patient expressed understanding and was in agreement with this plan. He also understands that He can call clinic at any time with any questions, concerns, or complaints.   Cancer Staging No matching staging information was found for the patient.  [name], MD   10/16/2017 9:34 AM

## 2017-10-12 ENCOUNTER — Encounter (INDEPENDENT_AMBULATORY_CARE_PROVIDER_SITE_OTHER): Payer: Self-pay

## 2017-10-12 ENCOUNTER — Other Ambulatory Visit: Payer: Self-pay

## 2017-10-12 ENCOUNTER — Inpatient Hospital Stay: Payer: Medicare Other | Attending: Oncology | Admitting: Oncology

## 2017-10-12 ENCOUNTER — Encounter: Payer: Self-pay | Admitting: Oncology

## 2017-10-12 DIAGNOSIS — Z87891 Personal history of nicotine dependence: Secondary | ICD-10-CM | POA: Insufficient documentation

## 2017-10-12 DIAGNOSIS — Z79899 Other long term (current) drug therapy: Secondary | ICD-10-CM | POA: Diagnosis not present

## 2017-10-12 DIAGNOSIS — R972 Elevated prostate specific antigen [PSA]: Secondary | ICD-10-CM | POA: Insufficient documentation

## 2017-10-12 DIAGNOSIS — C61 Malignant neoplasm of prostate: Secondary | ICD-10-CM

## 2017-10-12 DIAGNOSIS — Z801 Family history of malignant neoplasm of trachea, bronchus and lung: Secondary | ICD-10-CM | POA: Diagnosis not present

## 2017-10-12 LAB — PSA: Prostatic Specific Antigen: 20.74 ng/mL — ABNORMAL HIGH (ref 0.00–4.00)

## 2017-10-12 NOTE — Progress Notes (Signed)
Here  for new pt eval  

## 2018-01-16 ENCOUNTER — Other Ambulatory Visit: Payer: Medicare Other

## 2018-01-16 NOTE — Progress Notes (Signed)
[place]  Telephone:(336) [phone] Fax:(336) [phone]  ID: Daniel Ryan OB: 1945-10-25  MR#: [id number]  ZTI#:[id number]  Patient Care Team: Dion Body, MD as PCP - General (Family Medicine)  CHIEF COMPLAINT: Prostate cancer/high-grade PIN  INTERVAL HISTORY: Patient returns to clinic today for repeat laboratory work and further evaluation.  He currently feels well and is asymptomatic. He has no neurologic complaints.  He denies any pain.  He has a good appetite and denies weight loss.  He has no chest pain or shortness of breath.  He denies any nausea, vomiting, constipation, or diarrhea.  He has no urinary complaints.  Patient feels at his baseline offers no specific complaints today.  REVIEW OF SYSTEMS:   Review of Systems  Constitutional: Negative.  Negative for fever, malaise/fatigue and weight loss.  Respiratory: Negative.  Negative for cough and shortness of breath.   Cardiovascular: Negative.  Negative for chest pain and leg swelling.  Gastrointestinal: Negative.  Negative for abdominal pain.  Genitourinary: Negative.  Negative for dysuria, frequency and hematuria.  Musculoskeletal: Negative.  Negative for back pain.  Skin: Negative.  Negative for rash.  Neurological: Negative.  Negative for sensory change, focal weakness, weakness and headaches.  Psychiatric/Behavioral: Negative.  The patient is not nervous/anxious.     As per HPI. Otherwise, a complete review of systems is negative.  PAST MEDICAL HISTORY: Past Medical History:  Diagnosis Date  . Borderline diabetes   . Prostate cancer ([place]) 07/2017    PAST SURGICAL HISTORY: Past Surgical History:  Procedure Laterality Date  . COLONOSCOPY    . COLONOSCOPY WITH PROPOFOL N/A 09/13/2016   Procedure: COLONOSCOPY WITH PROPOFOL;  Surgeon: Lollie Sails, MD;  Location: [facility] ENDOSCOPY;  Service: Endoscopy;  Laterality: N/A;  . FLEXIBLE SIGMOIDOSCOPY    . prostate biopsies    . SKIN  CANCER EXCISION     dr dasher    FAMILY HISTORY: Family History  Problem Relation Age of Onset  . Hypertension Mother   . Heart attack Father   . Lung cancer Father   . Diabetes Brother   . Bladder Cancer Neg Hx   . Kidney cancer Neg Hx   . Prostate cancer Neg Hx     ADVANCED DIRECTIVES (Y/N):  N  HEALTH MAINTENANCE: Social History   Tobacco Use  . Smoking status: Former Smoker    Packs/day: 2.00    Years: 20.00    Pack years: 40.00    Types: Cigarettes    Last attempt to quit: 10/12/1981    Years since quitting: 36.3  . Smokeless tobacco: Never Used  Substance Use Topics  . Alcohol use: Yes    Alcohol/week: 1.0 - 6.0 standard drinks    Types: 1 - 6 Cans of beer per week    Frequency: Never    Comment: 1-6 per day -varies  . Drug use: No     Colonoscopy:  PAP:  Bone density:  Lipid panel:  No Known Allergies  Current Outpatient Medications  Medication Sig Dispense Refill  . acetaminophen (TYLENOL) 500 MG tablet Take 500 mg by mouth every 6 (six) hours as needed.    . finasteride (PROSCAR) 5 MG tablet Take 1 tablet (5 mg total) by mouth daily. 90 tablet 3  . Multiple Vitamin (MULTIVITAMIN) tablet Take 1 tablet by mouth daily.    . traZODone (DESYREL) 50 MG tablet Take 50 mg by mouth at bedtime.    . clobetasol cream (TEMOVATE) 8.25 % Apply 1 application topically 2 (  two) times daily.    Marland Kitchen loratadine (CLARITIN) 10 MG tablet Take 10 mg by mouth daily.    . naproxen sodium (ALEVE) 220 MG tablet Take 220 mg by mouth daily as needed.     No current facility-administered medications for this visit.     OBJECTIVE: Vitals:   01/19/18 1507  BP: (!) 151/83  Pulse: 80  Temp: 97.8 F (36.6 C)     Body mass index is 34.15 kg/m.    ECOG FS:0 - Asymptomatic  General: Well-developed, well-nourished, no acute distress. Eyes: Pink conjunctiva, anicteric sclera. HEENT: Normocephalic, moist mucous membranes. Lungs: Clear to auscultation bilaterally. Heart: Regular  rate and rhythm. No rubs, murmurs, or gallops. Abdomen: Soft, nontender, nondistended. No organomegaly noted, normoactive bowel sounds. Musculoskeletal: No edema, cyanosis, or clubbing. Neuro: Alert, answering all questions appropriately. Cranial nerves grossly intact. Skin: No rashes or petechiae noted. Psych: Normal affect.  LAB RESULTS:  No results found for: NA, K, CL, CO2, GLUCOSE, BUN, CREATININE, CALCIUM, PROT, ALBUMIN, AST, ALT, ALKPHOS, BILITOT, GFRNONAA, GFRAA  No results found for: WBC, NEUTROABS, HGB, HCT, MCV, PLT   STUDIES: No results found.  ASSESSMENT: Prostate cancer/high-grade PIN  PLAN:    1. Prostate cancer/high-grade PIN: Patient's PSA has trended down and is now 15.45.  Nuclear medicine bone scan completed on August 19, 2017 revealed increased activity over the lower thoracic and mid lumbar spine.  These are nonspecific, although could be representative of metastatic disease.  Patient has had multiple prostate biopsies in October 2009, December 2010, and February 2012.  All biopsies returned benign pathology.  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 Pathology was sent to [facility] for second opinion who concurred with the diagnosis. Prostate MRI in March 2016 revealed no findings suggestive of malignancy.  Patient has declined any further biopsies.  Treatment with Lupron was previously discussed, but patient declined.  We will continue active surveillance at this time.  No further imaging or biopsies are necessary unless there is suspicion of progression of disease.  Patient has been instructed to keep his follow-up with urology as scheduled.  Return to clinic in 6 months with repeat laboratory work and further evaluation.  I spent a total of 20 minutes face-to-face with the patient of which greater than 50% of the visit was spent in counseling and coordination of care as detailed above.   Patient expressed understanding and was in agreement with this  plan. He also understands that He can call clinic at any time with any questions, concerns, or complaints.   Cancer Staging No matching staging information was found for the patient.  [name], MD   01/21/2018 9:58 AM

## 2018-01-17 ENCOUNTER — Ambulatory Visit: Payer: Medicare Other | Admitting: Oncology

## 2018-01-18 ENCOUNTER — Inpatient Hospital Stay: Payer: Medicare Other | Attending: Oncology

## 2018-01-18 DIAGNOSIS — Z79899 Other long term (current) drug therapy: Secondary | ICD-10-CM | POA: Insufficient documentation

## 2018-01-18 DIAGNOSIS — C61 Malignant neoplasm of prostate: Secondary | ICD-10-CM | POA: Diagnosis present

## 2018-01-18 DIAGNOSIS — Z87891 Personal history of nicotine dependence: Secondary | ICD-10-CM | POA: Insufficient documentation

## 2018-01-18 LAB — PSA: PROSTATIC SPECIFIC ANTIGEN: 15.45 ng/mL — AB (ref 0.00–4.00)

## 2018-01-19 ENCOUNTER — Other Ambulatory Visit: Payer: Self-pay

## 2018-01-19 ENCOUNTER — Inpatient Hospital Stay (HOSPITAL_BASED_OUTPATIENT_CLINIC_OR_DEPARTMENT_OTHER): Payer: Medicare Other | Admitting: Oncology

## 2018-01-19 VITALS — BP 151/83 | HR 80 | Temp 97.8°F | Ht 70.0 in | Wt 238.0 lb

## 2018-01-19 DIAGNOSIS — Z79899 Other long term (current) drug therapy: Secondary | ICD-10-CM | POA: Diagnosis not present

## 2018-01-19 DIAGNOSIS — C61 Malignant neoplasm of prostate: Secondary | ICD-10-CM | POA: Diagnosis not present

## 2018-01-19 DIAGNOSIS — Z87891 Personal history of nicotine dependence: Secondary | ICD-10-CM

## 2018-01-19 NOTE — Progress Notes (Signed)
Patient is here today to follow up on his prostate cancer. Patient stated that he had been doing well.

## 2018-03-16 ENCOUNTER — Other Ambulatory Visit: Payer: Medicare Other

## 2018-03-16 DIAGNOSIS — R972 Elevated prostate specific antigen [PSA]: Secondary | ICD-10-CM

## 2018-03-17 LAB — PSA: PROSTATE SPECIFIC AG, SERUM: 19.2 ng/mL — AB (ref 0.0–4.0)

## 2018-03-23 ENCOUNTER — Ambulatory Visit: Payer: Medicare Other | Admitting: Urology

## 2018-03-23 ENCOUNTER — Encounter: Payer: Self-pay | Admitting: Urology

## 2018-03-23 NOTE — Progress Notes (Incomplete)
03/23/2018  8:34 AM   Tomma Rakers 11-17-1945 [id number]  Referring provider: Dion Body, MD [facility] [place], [place] [address]  No chief complaint on file.   HPI: Daniel Ryan is a 73 yo M who returns today for the evaluation and management of intermediate prostate cancer risk.  -Had multiple prostate biopsies in October 2009, December 2010, and February 2012; all biopsies returned benign pathology.  -Fourth biopsy in December 2013 revealed high-grade PIN  -[facility] 06/30/2017 for a PSA of 28.4 and a 2 cm anterior PI-RADS 3 lesion on MRI  -Prostate MRI in March 2016 revealed no findings of malignancy -Pathology of MRI lesion showed benign prostate tissues however the right apical core showed Gleason 3+4 adenocarcinoma involving 20%. -Declined any further biopsies and treatment of Lupron -Pt wishes to continue active surveillance  -Nuclear medicine bone scan from August 19, 2017 revealed mild increased activity noted over lower thoracic and mid lumbar spine and over posterior ribs bilaterally. Possibility of metastatic disease.  -Most recent PSA 19.2 from 03/16/2018    PMH: Past Medical History:  Diagnosis Date   Borderline diabetes    Prostate cancer ([place]) 07/2017    Surgical History: Past Surgical History:  Procedure Laterality Date   COLONOSCOPY     COLONOSCOPY WITH PROPOFOL N/A 09/13/2016   Procedure: COLONOSCOPY WITH PROPOFOL;  Surgeon: Lollie Sails, MD;  Location: [facility] ENDOSCOPY;  Service: Endoscopy;  Laterality: N/A;   FLEXIBLE SIGMOIDOSCOPY     prostate biopsies     SKIN CANCER EXCISION     dr dasher    Home Medications:  Allergies as of 03/23/2018   No Known Allergies     Medication List       Accurate as of March 23, 2018  8:34 AM. Always use your most recent med list.        acetaminophen 500 MG tablet Commonly known as:  TYLENOL Take 500 mg by  mouth every 6 (six) hours as needed.   clobetasol cream 0.05 % Commonly known as:  TEMOVATE Apply 1 application topically 2 (two) times daily.   finasteride 5 MG tablet Commonly known as:  PROSCAR Take 1 tablet (5 mg total) by mouth daily.   loratadine 10 MG tablet Commonly known as:  CLARITIN Take 10 mg by mouth daily.   multivitamin tablet Take 1 tablet by mouth daily.   naproxen sodium 220 MG tablet Commonly known as:  ALEVE Take 220 mg by mouth daily as needed.   traZODone 50 MG tablet Commonly known as:  DESYREL Take 50 mg by mouth at bedtime.       Allergies: No Known Allergies  Family History: Family History  Problem Relation Age of Onset   Hypertension Mother    Heart attack Father    Lung cancer Father    Diabetes Brother    Bladder Cancer Neg Hx    Kidney cancer Neg Hx    Prostate cancer Neg Hx     Social History:  reports that he quit smoking about 36 years ago. His smoking use included cigarettes. He has a 40.00 pack-year smoking history. He has never used smokeless tobacco. He reports current alcohol use of about 1.0 - 6.0 standard drinks of alcohol per week. He reports that he does not use drugs.  ROS:  Physical Exam: There were no vitals taken for this visit.  Constitutional:  Alert and oriented, No acute distress. HEENT: [place] AT, moist mucus membranes.  Trachea midline, no masses. Cardiovascular: No clubbing, cyanosis, or edema. Respiratory: Normal respiratory effort, no increased work of breathing. GI: Abdomen is soft, nontender, nondistended, no abdominal masses GU: No CVA tenderness Lymph: No cervical or inguinal lymphadenopathy. Skin: No rashes, bruises or suspicious lesions. Neurologic: Grossly intact, no focal deficits, moving all 4 extremities. Psychiatric: Normal mood and affect.  Laboratory Data:  Component     Latest Ref Rng & Units 03/23/2017 03/16/2018  Prostate  Specific Ag, Serum     0.0 - 4.0 ng/mL 28.4 (H) 19.2 (H)    Urinalysis   Pertinent Imaging: ***   Assessment & Plan:    1. Prostate Cancer/high-grade PIN - Most recent PSA is 19.2 from 03/16/2018 - Nuclear medicine bone scan on August 19, 2017 revealed increased activity over the lower thoracic and mid lumbar spine; possibility due to metastasis disease    No follow-ups on file.  [facility] [address], [place], [place] [address] [phone]  I, Lucas Mallow, am acting as a scribe for Dr. Nicki Reaper C. Stoioff,  {Add Media planner

## 2018-04-13 ENCOUNTER — Encounter: Payer: Self-pay | Admitting: Urology

## 2018-04-13 ENCOUNTER — Ambulatory Visit: Payer: Medicare Other | Admitting: Urology

## 2018-04-13 ENCOUNTER — Ambulatory Visit (INDEPENDENT_AMBULATORY_CARE_PROVIDER_SITE_OTHER): Payer: Medicare Other | Admitting: Urology

## 2018-04-13 VITALS — BP 154/80 | HR 67 | Ht 71.0 in | Wt 240.2 lb

## 2018-04-13 DIAGNOSIS — E1169 Type 2 diabetes mellitus with other specified complication: Secondary | ICD-10-CM | POA: Insufficient documentation

## 2018-04-13 DIAGNOSIS — E6609 Other obesity due to excess calories: Secondary | ICD-10-CM | POA: Insufficient documentation

## 2018-04-13 DIAGNOSIS — J302 Other seasonal allergic rhinitis: Secondary | ICD-10-CM | POA: Insufficient documentation

## 2018-04-13 DIAGNOSIS — C61 Malignant neoplasm of prostate: Secondary | ICD-10-CM | POA: Diagnosis not present

## 2018-04-13 DIAGNOSIS — E785 Hyperlipidemia, unspecified: Secondary | ICD-10-CM

## 2018-04-13 DIAGNOSIS — E7800 Pure hypercholesterolemia, unspecified: Secondary | ICD-10-CM | POA: Insufficient documentation

## 2018-04-13 NOTE — Progress Notes (Signed)
04/13/2018 12:52 PM   Tomma Rakers [birth year] [id number]  Referring provider: Dion Body, MD [facility] [place], [place] [address]  Chief Complaint  Patient presents with  . Prostate Cancer   Urologic history: 1. 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  -Fusion biopsy 06/2017; PSA 28.4; ROI negative for cancer however right apical biopsy with Gleason 3+4 adenocarcinoma involving 20% submitted tissue  -Declined treatment and elected surveillance  -Prior negative biopsies 2009, 2010 and 2012 for PSA levels 9.5, 13 and 20 respectively all with benign pathology  -Saturation biopsies December 2013 PSA 29.9; 1 core with focus high-grade PIN  -Prostate MRI 02/2014 PSA bump 36.7 with no findings suggestive high-grade cancer  -Repeat prostate MRI 4/29; PSA 28.4; 27 g prostate with PI-RADS 3 lesion transition zone  -Bone scan 08/2017 multiple areas slightly increased activity in spine, ribs and left lower extremity felt unlikely related to prostate cancer   HPI: 73 year old male presents for follow-up of prostate cancer.  He declined curative treatment and elected surveillance.  He was referred to medical oncology by his PCP in August 2019 was also seen in December.  It does not appear Dr. Grayland Ormond had the information of the fusion biopsy in [facility] showing Gleason 3+4 prostate cancer.  PSA performed 03/16/2018 was 19.2.  He has no bothersome lower urinary tract symptoms.  Denies weight loss/bony pain or gross hematuria.   PMH: Past Medical History:  Diagnosis Date  . Borderline diabetes   . Prostate cancer ([place]) 07/2017    Surgical History: Past Surgical History:  Procedure Laterality Date  . COLONOSCOPY    . COLONOSCOPY WITH PROPOFOL N/A 09/13/2016   Procedure: COLONOSCOPY WITH PROPOFOL;  Surgeon: Lollie Sails, MD;  Location: [facility] ENDOSCOPY;  Service: Endoscopy;  Laterality: N/A;  . FLEXIBLE SIGMOIDOSCOPY    . prostate biopsies    .  SKIN CANCER EXCISION     dr dasher    Home Medications:  Allergies as of 04/13/2018   No Known Allergies     Medication List       Accurate as of April 13, 2018 12:52 PM. Always use your most recent med list.        acetaminophen 500 MG tablet Commonly known as:  TYLENOL Take 500 mg by mouth every 6 (six) hours as needed.   clobetasol cream 0.05 % Commonly known as:  TEMOVATE Apply 1 application topically 2 (two) times daily.   DENTA 5000 PLUS 1.1 % Crea dental cream Generic drug:  sodium fluoride USE ONCE TO TWICE PER DAY ATLEAST ONCE BEFORE BEDTIME   finasteride 5 MG tablet Commonly known as:  PROSCAR Take 1 tablet (5 mg total) by mouth daily.   lidocaine 2 % solution Commonly known as:  XYLOCAINE   loratadine 10 MG tablet Commonly known as:  CLARITIN Take 10 mg by mouth daily.   magic mouthwash w/lidocaine Soln SWISH WITH 15 MLS FOR 1 MINUTE EVERY 4 TO 6 HOURSDO NOT EAT/DRINK FOR 30 MINUTES TO 1 HOUR AFTERWARD   multivitamin tablet Take 1 tablet by mouth daily.   naproxen sodium 220 MG tablet Commonly known as:  ALEVE Take 220 mg by mouth daily as needed.   traZODone 50 MG tablet Commonly known as:  DESYREL Take 50 mg by mouth at bedtime.       Allergies: No Known Allergies  Family History: Family History  Problem Relation Age of Onset  . Hypertension Mother   . Heart attack Father   .  Lung cancer Father   . Diabetes Brother   . Bladder Cancer Neg Hx   . Kidney cancer Neg Hx   . Prostate cancer Neg Hx     Social History:  reports that he quit smoking about 36 years ago. His smoking use included cigarettes. He has a 40.00 pack-year smoking history. He has never used smokeless tobacco. He reports current alcohol use of about 1.0 - 6.0 standard drinks of alcohol per week. He reports that he does not use drugs.  ROS: UROLOGY Frequent Urination?: No Hard to postpone urination?: No Burning/pain with urination?: No Get up at night to  urinate?: Yes Leakage of urine?: No Urine stream starts and stops?: No Trouble starting stream?: No Do you have to strain to urinate?: No Blood in urine?: No Urinary tract infection?: No Sexually transmitted disease?: No Injury to kidneys or bladder?: No Painful intercourse?: No Weak stream?: No Erection problems?: No Penile pain?: No  Gastrointestinal Nausea?: No Vomiting?: No Indigestion/heartburn?: No Diarrhea?: No Constipation?: No  Constitutional Fever: No Night sweats?: No Weight loss?: No Fatigue?: No  Skin Skin rash/lesions?: No Itching?: No  Eyes Blurred vision?: No Double vision?: No  Ears/Nose/Throat Sore throat?: No Sinus problems?: No  Hematologic/Lymphatic Swollen glands?: No Easy bruising?: No  Cardiovascular Leg swelling?: No Chest pain?: No  Respiratory Cough?: No Shortness of breath?: No  Endocrine Excessive thirst?: No  Musculoskeletal Back pain?: No Joint pain?: No  Neurological Headaches?: No Dizziness?: No  Psychologic Depression?: No Anxiety?: No  Physical Exam: BP (!) 154/80 (BP Location: Left Arm, Patient Position: Sitting, Cuff Size: Large)   Pulse 67   Ht 5\' 11"  (1.803 m)   Wt 240 lb 3.2 oz (109 kg)   BMI 33.50 kg/m   Constitutional:  Alert and oriented, No acute distress. HEENT: [place] AT, moist mucus membranes.  Trachea midline, no masses. Cardiovascular: No clubbing, cyanosis, or edema. Respiratory: Normal respiratory effort, no increased work of breathing. GI: Abdomen is soft, nontender, nondistended, no abdominal masses GU: No CVA tenderness.  Prostate 30 g, smooth without nodules or induration Lymph: No cervical or inguinal lymphadenopathy. Skin: No rashes, bruises or suspicious lesions. Neurologic: Grossly intact, no focal deficits, moving all 4 extremities. Psychiatric: Normal mood and affect.   Assessment & Plan:   73 year old male with clinical T1c intermediate risk prostate cancer who has elected  surveillance.  His most recent PSA was below baseline.  DRE is benign.  We again discussed surveillance is not recommended for patients with intermediate risk disease however he is not interested in moving forward with treatment.  Recommend a follow-up PSA/DRE 6 months.   Abbie Sons, [place] [address] [place], [place] [address] [phone]

## 2018-04-18 ENCOUNTER — Telehealth: Payer: Self-pay | Admitting: Urology

## 2018-04-18 NOTE — Telephone Encounter (Signed)
Pt needs a refill for his Finasteride.

## 2018-04-19 ENCOUNTER — Other Ambulatory Visit: Payer: Self-pay | Admitting: *Deleted

## 2018-04-19 MED ORDER — FINASTERIDE 5 MG PO TABS: 5.0000 mg | ORAL_TABLET | Freq: Every day | ORAL | 3 refills | Status: DC

## 2018-07-30 DIAGNOSIS — Z7189 Other specified counseling: Secondary | ICD-10-CM | POA: Insufficient documentation

## 2018-07-30 NOTE — Progress Notes (Signed)
[place]  [phone] [phone]) [phone]  ID: Daniel Ryan OB: Dec 22, 1945  MR#: [id number]  QIO#:[id number]  Patient Care Team: Dion Body, MD as PCP - General (Family Medicine)  I connected with Daniel Ryan on 08/04/18 at  2:30 PM EDT by telephone visit and verified that I am speaking with the correct person using two identifiers.   I discussed the limitations, risks, security and privacy concerns of performing an evaluation and management service by telemedicine and the availability of in-person appointments. I also discussed with the patient that there may be a patient responsible charge related to this service. The patient expressed understanding and agreed to proceed.   Other persons participating in the visit and their role in the encounter: Patient, MD  Patient's location: Home Provider's location: Clinic  CHIEF COMPLAINT: Prostate cancer/high-grade PIN  INTERVAL HISTORY: Patient agreed to evaluation and discussion of his laboratory work by telephone.  He continues to feel well and remains asymptomatic.  He remains active.  He denies any pain.  He has no neurologic complaints.  He has a good appetite and denies weight loss.  He denies any chest pain, shortness of breath, cough, or hemoptysis.  He denies any nausea, vomiting, constipation, or diarrhea.  He has no urinary complaints.  Patient feels at his baseline offers no specific complaints today.  REVIEW OF SYSTEMS:   Review of Systems  Constitutional: Negative.  Negative for fever, malaise/fatigue and weight loss.  Respiratory: Negative.  Negative for cough and shortness of breath.   Cardiovascular: Negative.  Negative for chest pain and leg swelling.  Gastrointestinal: Negative.  Negative for abdominal pain.  Genitourinary: Negative.  Negative for dysuria, frequency and hematuria.  Musculoskeletal: Negative.  Negative for back pain.  Skin: Negative.  Negative for rash.   Neurological: Negative.  Negative for sensory change, focal weakness, weakness and headaches.  Psychiatric/Behavioral: Negative.  The patient is not nervous/anxious.     As per HPI. Otherwise, a complete review of systems is negative.  PAST MEDICAL HISTORY: Past Medical History:  Diagnosis Date  . Borderline diabetes   . Prostate cancer ([place]) 07/2017    PAST SURGICAL HISTORY: Past Surgical History:  Procedure Laterality Date  . COLONOSCOPY    . COLONOSCOPY WITH PROPOFOL N/A 09/13/2016   Procedure: COLONOSCOPY WITH PROPOFOL;  Surgeon: Lollie Sails, MD;  [facility] ENDOSCOPY;  Service: Endoscopy;  Laterality: N/A;  . FLEXIBLE SIGMOIDOSCOPY    . prostate biopsies    . SKIN CANCER EXCISION     dr dasher    FAMILY HISTORY: Family History  Problem Relation Age of Onset  . Hypertension Mother   . Heart attack Father   . Lung cancer Father   . Diabetes Brother   . Bladder Cancer Neg Hx   . Kidney cancer Neg Hx   . Prostate cancer Neg Hx     ADVANCED DIRECTIVES (Y/N):  N  HEALTH MAINTENANCE: Social History   Tobacco Use  . Smoking status: Former Smoker    Packs/day: 2.00    Years: 20.00    Pack years: 40.00    Types: Cigarettes    Quit date: 10/12/1981    Years since quitting: 36.8  . Smokeless tobacco: Never Used  Substance Use Topics  . Alcohol use: Yes    Alcohol/week: 1.0 - 6.0 standard drinks    Types: 1 - 6 Cans of beer per week    Frequency: Never    Comment: 1-6 per day -varies  .  Drug use: No     Colonoscopy:  PAP:  Bone density:  Lipid panel:  No Known Allergies  Current Outpatient Medications  Medication Sig Dispense Refill  . acetaminophen (TYLENOL) 500 MG tablet Take 500 mg by mouth every 6 (six) hours as needed.    . clobetasol cream (TEMOVATE) 7.58 % Apply 1 application topically 2 (two) times daily.    . DENTA 5000 PLUS 1.1 % CREA dental cream USE ONCE TO TWICE PER DAY ATLEAST ONCE BEFORE BEDTIME    . finasteride (PROSCAR) 5 MG  tablet Take 1 tablet (5 mg total) by mouth daily. 90 tablet 3  . loratadine (CLARITIN) 10 MG tablet Take 10 mg by mouth daily.    . naproxen sodium (ALEVE) 220 MG tablet Take 220 mg by mouth daily as needed.    . traZODone (DESYREL) 50 MG tablet Take 50 mg by mouth at bedtime.     No current facility-administered medications for this visit.     OBJECTIVE: There were no vitals filed for this visit.   There is no height or weight on file to calculate BMI.    ECOG FS:0 - Asymptomatic  LAB RESULTS:  No results found for: NA, K, CL, CO2, GLUCOSE, BUN, CREATININE, CALCIUM, PROT, ALBUMIN, AST, ALT, ALKPHOS, BILITOT, GFRNONAA, GFRAA  No results found for: WBC, NEUTROABS, HGB, HCT, MCV, PLT   STUDIES: No results found.  ASSESSMENT: Prostate cancer/high-grade PIN  PLAN:    1. Prostate cancer/high-grade PIN: Patient's PSA remains elevated, but essentially stable ranging between 15 and 21 since August 2019. Nuclear medicine bone scan completed on August 19, 2017 revealed increased activity over the lower thoracic and mid lumbar spine.  These are nonspecific, although could be representative of metastatic disease.  Patient has had multiple prostate biopsies in October 2009, December 2010, and February 2012.  All biopsies returned benign pathology.  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 Pathology was sent to [facility] for second opinion who concurred with the diagnosis. Prostate MRI in March 2016 revealed no findings suggestive of malignancy.  Patient has declined any further biopsies.  Treatment with Lupron was previously discussed, but patient declined.  Continue active surveillance.  No further imaging or biopsies are necessary unless there is suspicion of progression of disease.  Continue follow-up with urology as scheduled.  Return to clinic in 6 months with repeat laboratory work and further evaluation.  I provided 15 minutes of non face-to-face telephone visit time during this  encounter, and > 50% was spent counseling as documented under my assessment & plan.   Patient expressed understanding and was in agreement with this plan. He also understands that He can call clinic at any time with any questions, concerns, or complaints.   Cancer Staging No matching staging information was found for the patient.  [name], MD   08/04/2018 11:42 AM

## 2018-07-31 ENCOUNTER — Inpatient Hospital Stay: Payer: Medicare Other | Attending: Oncology

## 2018-07-31 ENCOUNTER — Other Ambulatory Visit: Payer: Self-pay

## 2018-07-31 DIAGNOSIS — Z87891 Personal history of nicotine dependence: Secondary | ICD-10-CM | POA: Insufficient documentation

## 2018-07-31 DIAGNOSIS — Z85828 Personal history of other malignant neoplasm of skin: Secondary | ICD-10-CM | POA: Diagnosis not present

## 2018-07-31 DIAGNOSIS — Z79899 Other long term (current) drug therapy: Secondary | ICD-10-CM | POA: Diagnosis not present

## 2018-07-31 DIAGNOSIS — C61 Malignant neoplasm of prostate: Secondary | ICD-10-CM

## 2018-07-31 LAB — PSA: Prostatic Specific Antigen: 19.39 ng/mL — ABNORMAL HIGH (ref 0.00–4.00)

## 2018-08-01 ENCOUNTER — Other Ambulatory Visit: Payer: Medicare Other

## 2018-08-01 ENCOUNTER — Ambulatory Visit: Payer: Medicare Other | Admitting: Oncology

## 2018-08-03 ENCOUNTER — Encounter: Payer: Self-pay | Admitting: Oncology

## 2018-08-03 ENCOUNTER — Other Ambulatory Visit: Payer: Self-pay

## 2018-08-03 ENCOUNTER — Inpatient Hospital Stay (HOSPITAL_BASED_OUTPATIENT_CLINIC_OR_DEPARTMENT_OTHER): Payer: Medicare Other | Admitting: Oncology

## 2018-08-03 DIAGNOSIS — Z87891 Personal history of nicotine dependence: Secondary | ICD-10-CM | POA: Diagnosis not present

## 2018-08-03 DIAGNOSIS — Z7189 Other specified counseling: Secondary | ICD-10-CM

## 2018-08-03 DIAGNOSIS — C61 Malignant neoplasm of prostate: Secondary | ICD-10-CM | POA: Diagnosis not present

## 2018-08-03 NOTE — Progress Notes (Signed)
Patient denies any concerns today.  

## 2018-10-13 ENCOUNTER — Ambulatory Visit (INDEPENDENT_AMBULATORY_CARE_PROVIDER_SITE_OTHER): Payer: Medicare Other | Admitting: Urology

## 2018-10-13 ENCOUNTER — Other Ambulatory Visit: Payer: Self-pay

## 2018-10-13 ENCOUNTER — Encounter: Payer: Self-pay | Admitting: Urology

## 2018-10-13 VITALS — BP 154/77 | HR 73 | Ht 71.0 in | Wt 237.4 lb

## 2018-10-13 DIAGNOSIS — C61 Malignant neoplasm of prostate: Secondary | ICD-10-CM | POA: Diagnosis not present

## 2018-10-13 NOTE — Progress Notes (Signed)
10/13/2018 2:28 PM   Daniel Ryan 1945-04-10 [id number]  Referring provider: Dion Body, MD [facility] [name],  Daniel Ryan [id number]  Chief Complaint  Patient presents with  . Prostate Cancer    Urologic history: 1. 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             -Fusion biopsy 06/2017; PSA 28.4; ROI negative for cancer however right apical biopsy with Gleason 3+4 adenocarcinoma involving 20% submitted tissue             -Declined treatment and elected surveillance             -Prior negative biopsies 2009, 2010 and 2012 for PSA levels 9.5, 13 and 20 respectively all with benign pathology             -Saturation biopsies December 2013 PSA 29.9; 1 core with focus high-grade PIN             -Prostate MRI 02/2014 PSA bump 36.7 with no findings suggestive high-grade cancer             -Repeat prostate MRI 4/29; PSA 28.4; 27 g prostate with PI-RADS 3 lesion transition zone             -Bone scan 08/2017 multiple areas slightly increased activity in spine, ribs and left lower extremity felt unlikely related to prostate cancer  HPI: [age]Y old male presents for semiannual follow-up of prostate cancer.  Overall he states he is doing well.  He has no bothersome lower urinary tract symptoms. He remains on finasteride which he has been taking for 10+ years. He denies dysuria or gross hematuria. PSA drawn on 07/31/2018 was 19.39.  PMH: Past Medical History:  Diagnosis Date  . Borderline diabetes   . Prostate cancer ([place]) 07/2017    Surgical History: Past Surgical History:  Procedure Laterality Date  . COLONOSCOPY    . COLONOSCOPY WITH PROPOFOL N/A 09/13/2016   Procedure: COLONOSCOPY WITH PROPOFOL;  Surgeon: Daniel Sails, MD;  Location: [facility] ENDOSCOPY;  Service: Endoscopy;  Laterality: N/A;  . FLEXIBLE SIGMOIDOSCOPY    . prostate biopsies    . SKIN CANCER EXCISION     Daniel Ryan    Home Medications:  Allergies as of 10/13/2018   No Known  Allergies     Medication List       Accurate as of October 13, 2018  2:28 PM. If you have any questions, ask your nurse or doctor.        acetaminophen 500 MG tablet Commonly known as: TYLENOL Take 500 mg by mouth every 6 (six) hours as needed.   clobetasol cream 0.05 % Commonly known as: TEMOVATE Apply 1 application topically as needed.   Denta 5000 Plus 1.1 % Crea dental cream Generic drug: sodium fluoride USE ONCE TO TWICE PER DAY ATLEAST ONCE BEFORE BEDTIME   finasteride 5 MG tablet Commonly known as: PROSCAR Take 1 tablet (5 mg total) by mouth daily.   GaviLyte-N with Flavor Pack 420 g solution Generic drug: polyethylene glycol-electrolytes TAKE 4,000 MLS BY MOUTH ONCE FOR 1 DOSE USE AS DIRECTED FOR COLONOSCOPY   ibuprofen 200 MG tablet Commonly known as: ADVIL Take by mouth.   loratadine 10 MG tablet Commonly known as: CLARITIN Take 10 mg by mouth daily.   naproxen sodium 220 MG tablet Commonly known as: ALEVE Take 220 mg by mouth daily as needed.   traZODone 50 MG tablet Commonly known as: DESYREL Take 50  mg by mouth at bedtime.       Allergies: No Known Allergies  Family History: Family History  Problem Relation Age of Onset  . Hypertension Mother   . Heart attack Father   . Lung cancer Father   . Diabetes Brother   . Bladder Cancer Neg Hx   . Kidney cancer Neg Hx   . Prostate cancer Neg Hx     Social History:  reports that he quit smoking about 37 years ago. His smoking use included cigarettes. He has a 40.00 pack-year smoking history. He has never used smokeless tobacco. He reports current alcohol use of about 1.0 - 6.0 standard drinks of alcohol per week. He reports that he does not use drugs.  ROS: UROLOGY Frequent Urination?: No Hard to postpone urination?: No Burning/pain with urination?: No Get up at night to urinate?: Yes Leakage of urine?: No Urine stream starts and stops?: No Trouble starting stream?: No Do you have to strain  to urinate?: No Blood in urine?: No Urinary tract infection?: No Sexually transmitted disease?: No Injury to kidneys or bladder?: No Painful intercourse?: No Weak stream?: No Erection problems?: No Penile pain?: No  Gastrointestinal Nausea?: No Vomiting?: No Indigestion/heartburn?: No Diarrhea?: No Constipation?: No  Constitutional Fever: No Night sweats?: No Weight loss?: No Fatigue?: No  Skin Skin rash/lesions?: No Itching?: No  Eyes Blurred vision?: No Double vision?: No  Ears/Nose/Throat Sore throat?: No Sinus problems?: No  Hematologic/Lymphatic Swollen glands?: No Easy bruising?: Yes  Cardiovascular Leg swelling?: No Chest pain?: No  Respiratory Cough?: No Shortness of breath?: No  Endocrine Excessive thirst?: No  Musculoskeletal Back pain?: No Joint pain?: No  Neurological Headaches?: No Dizziness?: No  Psychologic Depression?: No Anxiety?: No  Physical Exam: BP (!) 154/77 (BP Location: Left Arm, Patient Position: Sitting, Cuff Size: Normal)   Pulse 73   Ht 5\' 11"  (1.803 m)   Wt 237 lb 6.4 oz (107.7 kg)   BMI 33.11 kg/m   Constitutional:  Alert and oriented, No acute distress. HEENT: Daniel Ryan AT, moist mucus membranes.  Trachea midline, no masses. Cardiovascular: No clubbing, cyanosis, or edema. Respiratory: Normal respiratory effort, no increased work of breathing. GI: Abdomen is soft, nontender, nondistended, no abdominal masses GU: Prostate 35 g, smooth without nodules Lymph: No cervical or inguinal lymphadenopathy. Skin: No rashes, bruises or suspicious lesions. Neurologic: Grossly intact, no focal deficits, moving all 4 extremities. Psychiatric: Normal mood and affect.   Assessment & Plan:    -T1c intermediate risk prostate cancer His PSA is stable although significantly elevated.  He has declined treatment of his prostate cancer and we again discussed that treatment for intermediate risk prostate cancer is recommended.  He  desires to continue active surveillance.  He also wanted to stop his finasteride.  He was informed that the PSA should increase by 50%.  Follow-up 6 months with PSA/DRE.  Daniel Ryan, [place] [address] [place], [place] [address] [phone]

## 2018-10-16 ENCOUNTER — Encounter: Payer: Self-pay | Admitting: Urology

## 2018-11-20 ENCOUNTER — Other Ambulatory Visit
Admission: RE | Admit: 2018-11-20 | Discharge: 2018-11-20 | Disposition: A | Discharge status: Home / Self Care | Payer: Medicare Other | Source: Ambulatory Visit | Attending: Gastroenterology | Admitting: Gastroenterology

## 2018-11-20 ENCOUNTER — Other Ambulatory Visit: Payer: Self-pay

## 2018-11-20 DIAGNOSIS — Z20828 Contact with and (suspected) exposure to other viral communicable diseases: Secondary | ICD-10-CM | POA: Diagnosis not present

## 2018-11-20 DIAGNOSIS — Z01812 Encounter for preprocedural laboratory examination: Secondary | ICD-10-CM | POA: Insufficient documentation

## 2018-11-20 LAB — SARS CORONAVIRUS 2 (TAT 6-24 HRS): SARS Coronavirus 2: NEGATIVE

## 2018-11-23 ENCOUNTER — Ambulatory Visit
Admission: RE | Admit: 2018-11-23 | Discharge: 2018-11-23 | Disposition: A | Discharge status: Home / Self Care | Payer: Medicare Other | Attending: Gastroenterology | Admitting: Gastroenterology

## 2018-11-23 ENCOUNTER — Ambulatory Visit: Payer: Medicare Other | Admitting: Certified Registered Nurse Anesthetist

## 2018-11-23 ENCOUNTER — Other Ambulatory Visit: Payer: Self-pay

## 2018-11-23 ENCOUNTER — Encounter
Admission: RE | Disposition: A | Discharge status: Home / Self Care | Payer: Self-pay | Source: Home / Self Care | Attending: Gastroenterology

## 2018-11-23 DIAGNOSIS — K621 Rectal polyp: Secondary | ICD-10-CM | POA: Diagnosis not present

## 2018-11-23 DIAGNOSIS — Z85828 Personal history of other malignant neoplasm of skin: Secondary | ICD-10-CM | POA: Diagnosis not present

## 2018-11-23 DIAGNOSIS — Z87891 Personal history of nicotine dependence: Secondary | ICD-10-CM | POA: Insufficient documentation

## 2018-11-23 DIAGNOSIS — Z09 Encounter for follow-up examination after completed treatment for conditions other than malignant neoplasm: Secondary | ICD-10-CM | POA: Diagnosis present

## 2018-11-23 DIAGNOSIS — Z79899 Other long term (current) drug therapy: Secondary | ICD-10-CM | POA: Diagnosis not present

## 2018-11-23 DIAGNOSIS — D124 Benign neoplasm of descending colon: Secondary | ICD-10-CM | POA: Diagnosis not present

## 2018-11-23 DIAGNOSIS — Z86010 Personal history of colonic polyps: Secondary | ICD-10-CM | POA: Diagnosis not present

## 2018-11-23 DIAGNOSIS — Z8546 Personal history of malignant neoplasm of prostate: Secondary | ICD-10-CM | POA: Insufficient documentation

## 2018-11-23 HISTORY — PX: COLONOSCOPY WITH PROPOFOL: SHX5780

## 2018-11-23 SURGERY — COLONOSCOPY WITH PROPOFOL
Anesthesia: General

## 2018-11-23 MED ORDER — LIDOCAINE HCL (CARDIAC) PF 100 MG/5ML IV SOSY
PREFILLED_SYRINGE | INTRAVENOUS | Status: DC | PRN
  Administered 2018-11-23: 100 mg via INTRAVENOUS

## 2018-11-23 MED ORDER — PROPOFOL 10 MG/ML IV BOLUS
INTRAVENOUS | Status: DC | PRN
  Administered 2018-11-23: 30 mg via INTRAVENOUS
  Administered 2018-11-23: 70 mg via INTRAVENOUS

## 2018-11-23 MED ORDER — PROPOFOL 500 MG/50ML IV EMUL
INTRAVENOUS | Status: DC | PRN
  Administered 2018-11-23: 75 ug/kg/min via INTRAVENOUS

## 2018-11-23 MED ORDER — SODIUM CHLORIDE 0.9 % IV SOLN
INTRAVENOUS | Status: DC
  Administered 2018-11-23: 08:00:00 via INTRAVENOUS

## 2018-11-23 NOTE — Transfer of Care (Signed)
Immediate Anesthesia Transfer of Care Note  Patient: GREGGORY MABEN  Procedure(s) Performed: COLONOSCOPY WITH PROPOFOL (N/A )  Patient Location: PACU and Endoscopy Unit  Anesthesia Type:General  Level of Consciousness: awake, drowsy and patient cooperative  Airway & Oxygen Therapy: Patient Spontanous Breathing  Post-op Assessment: Report given to RN and Post -op Vital signs reviewed and stable  Post vital signs: Reviewed and stable  Last Vitals:  Vitals Value Taken Time  BP 100/65 11/23/18 0841  Temp 36.2 C 11/23/18 0841  Pulse 71 11/23/18 0843  Resp 17 11/23/18 0843  SpO2 98 % 11/23/18 0843  Vitals shown include unvalidated device data.  Last Pain:  Vitals:   11/23/18 0841  TempSrc: Tympanic  PainSc: 0-No pain         Complications: No apparent anesthesia complications

## 2018-11-23 NOTE — Anesthesia Post-op Follow-up Note (Signed)
Anesthesia QCDR form completed.        

## 2018-11-23 NOTE — Anesthesia Preprocedure Evaluation (Signed)
Anesthesia Evaluation  Patient identified by MRN, date of birth, ID band Patient awake    Reviewed: Allergy & Precautions, H&P , NPO status , Patient's Chart, lab work & pertinent test results, reviewed documented beta blocker date and time   Airway Mallampati: II   Neck ROM: full    Dental  (+) Poor Dentition   Pulmonary neg pulmonary ROS, former smoker,    Pulmonary exam normal        Cardiovascular negative cardio ROS Normal cardiovascular exam Rhythm:regular Rate:Normal     Neuro/Psych negative neurological ROS  negative psych ROS   GI/Hepatic negative GI ROS, Neg liver ROS,   Endo/Other  diabetes, Well Controlled  Renal/GU negative Renal ROS  negative genitourinary   Musculoskeletal   Abdominal   Peds  Hematology negative hematology ROS (+)   Anesthesia Other Findings Past Medical History: No date: Borderline diabetes 07/2017: Prostate cancer ([place]) Past Surgical History: No date: COLONOSCOPY 09/13/2016: COLONOSCOPY WITH PROPOFOL; N/A     Comment:  Procedure: COLONOSCOPY WITH PROPOFOL;  Surgeon:               Lollie Sails, MD;  Location: [facility] ENDOSCOPY;                Service: Endoscopy;  Laterality: N/A; No date: FLEXIBLE SIGMOIDOSCOPY No date: prostate biopsies No date: SKIN CANCER EXCISION     Comment:  dr dasher BMI    Body Mass Index: 34.01 kg/m     Reproductive/Obstetrics negative OB ROS                             Anesthesia Physical Anesthesia Plan  ASA: II  Anesthesia Plan: General   Post-op Pain Management:    Induction:   PONV Risk Score and Plan:   Airway Management Planned:   Additional Equipment:   Intra-op Plan:   Post-operative Plan:   Informed Consent: I have reviewed the patients History and Physical, chart, labs and discussed the procedure including the risks, benefits and alternatives for the proposed anesthesia with the patient or  authorized representative who has indicated his/her understanding and acceptance.     Dental Advisory Given  Plan Discussed with: CRNA  Anesthesia Plan Comments:         Anesthesia Quick Evaluation

## 2018-11-23 NOTE — Op Note (Signed)
[facility] Gastroenterology Patient Name: Daniel Ryan Procedure Date: 11/23/2018 7:55 AM MRN: [id number] Account #: [id number] Date of Birth: 1945/04/24 Admit Type: Outpatient Age: 73 Room: [facility] ENDO ROOM 2 Gender: Male Note Status: Finalized Procedure:            Colonoscopy Indications:          High risk colon cancer surveillance: Personal history                        of colonic polyps Providers:            Robert Bellow, MD Referring MD:         Dion Body (Referring MD) Medicines:            Monitored Anesthesia Care Complications:        No immediate complications. Procedure:            Pre-Anesthesia Assessment:                       - Prior to the procedure, a History and Physical was                        performed, and patient medications, allergies and                        sensitivities were reviewed. The patient's tolerance of                        previous anesthesia was reviewed.                       - The risks and benefits of the procedure and the                        sedation options and risks were discussed with the                        patient. All questions were answered and informed                        consent was obtained.                       After obtaining informed consent, the colonoscope was                        passed under direct vision. Throughout the procedure,                        the patient's blood pressure, pulse, and oxygen                        saturations were monitored continuously. The                        Colonoscope was introduced through the anus and                        advanced to the the cecum, identified by appendiceal  orifice and ileocecal valve. The colonoscopy was                        performed without difficulty. The patient tolerated the                        procedure well. The quality of the bowel preparation                        was  excellent. Findings:      A 5 mm polyp was found in the descending colon. The polyp was sessile.       This was biopsied with a cold forceps for histology.      The retroflexed view of the distal rectum and anal verge was normal and       showed no anal or rectal abnormalities. Impression:           - One 5 mm polyp in the descending colon. Biopsied.                       - The distal rectum and anal verge are normal on                        retroflexion view. Recommendation:       - Telephone endoscopist for pathology results in 1 week. Procedure Code(s):    --- Professional ---                       [phone], Colonoscopy, flexible; with biopsy, single or                        multiple Diagnosis Code(s):    --- Professional ---                       Z86.010, Personal history of colonic polyps                       K63.5, Polyp of colon CPT copyright 2019 American Medical Association. All rights reserved. The codes documented in this report are preliminary and upon coder review may  be revised to meet current compliance requirements. Robert Bellow, MD 11/23/2018 8:39:04 AM This report has been signed electronically. Number of Addenda: 0 Note Initiated On: 11/23/2018 7:55 AM Scope Withdrawal Time: 0 hours 18 minutes 1 second  Total Procedure Duration: 0 hours 26 minutes 0 seconds  Estimated Blood Loss: Estimated blood loss: none.      [facility]

## 2018-11-23 NOTE — H&P (Signed)
Daniel Ryan [id number] 1945/03/26     HPI:  73 year old male who on colonoscopy two years ago had seven adenomatous polyps in the right colon and transverse colon. For repeat exam. Tolerated prep well.   Medications Prior to Admission  Medication Sig Dispense Refill Last Dose  . acetaminophen (TYLENOL) 500 MG tablet Take 500 mg by mouth every 6 (six) hours as needed.   Past Month at Unknown time  . clobetasol cream (TEMOVATE) [id number] % Apply 1 application topically as needed.    Past Week at Unknown time  . DENTA 5000 PLUS 1.1 % CREA dental cream USE ONCE TO TWICE PER DAY ATLEAST ONCE BEFORE BEDTIME   Past Week at Unknown time  . finasteride (PROSCAR) 5 MG tablet Take 1 tablet (5 mg total) by mouth daily. 90 tablet 3 Past Week at Unknown time  . GAVILYTE-N WITH FLAVOR PACK 420 g solution TAKE 4,000 MLS BY MOUTH ONCE FOR 1 DOSE USE AS DIRECTED FOR COLONOSCOPY   11/22/2018 at Unknown time  . ibuprofen (ADVIL) 200 MG tablet Take by mouth.   Past Week at Unknown time  . loratadine (CLARITIN) 10 MG tablet Take 10 mg by mouth daily.   Past Week at Unknown time  . naproxen sodium (ALEVE) 220 MG tablet Take 220 mg by mouth daily as needed.   Past Week at Unknown time  . traZODone (DESYREL) 50 MG tablet Take 50 mg by mouth at bedtime.   Past Week at Unknown time   No Known Allergies Past Medical History:  Diagnosis Date  . Borderline diabetes   . Prostate cancer ([place]) 07/2017   Past Surgical History:  Procedure Laterality Date  . COLONOSCOPY    . COLONOSCOPY WITH PROPOFOL N/A 09/13/2016   Procedure: COLONOSCOPY WITH PROPOFOL;  Surgeon: Lollie Sails, MD;  Location: [facility] ENDOSCOPY;  Service: Endoscopy;  Laterality: N/A;  . FLEXIBLE SIGMOIDOSCOPY    . prostate biopsies    . SKIN CANCER EXCISION     dr dasher   Social History   Socioeconomic History  . Marital status: Married    Spouse name: Not on file  . Number of children: Not on file  . Years of education: Not on file  . Highest  education level: Not on file  Occupational History  . Not on file  Social Needs  . Financial resource strain: Not on file  . Food insecurity    Worry: Not on file    Inability: Not on file  . Transportation needs    Medical: Not on file    Non-medical: Not on file  Tobacco Use  . Smoking status: Former Smoker    Packs/day: 2.00    Years: 20.00    Pack years: 40.00    Types: Cigarettes    Quit date: 10/12/1981    Years since quitting: 37.1  . Smokeless tobacco: Never Used  Substance and Sexual Activity  . Alcohol use: Yes    Alcohol/week: 1.0 - 6.0 standard drinks    Types: 1 - 6 Cans of beer per week    Frequency: Never    Comment: 1-6 per day -varies  . Drug use: No  . Sexual activity: Not Currently  Lifestyle  . Physical activity    Days per week: Not on file    Minutes per session: Not on file  . Stress: Not on file  Relationships  . Social Herbalist on phone: Not on file    Gets  together: Not on file    Attends religious service: Not on file    Active member of club or organization: Not on file    Attends meetings of clubs or organizations: Not on file    Relationship status: Not on file  . Intimate partner violence    Fear of current or ex partner: Not on file    Emotionally abused: Not on file    Physically abused: Not on file    Forced sexual activity: Not on file  Other Topics Concern  . Not on file  Social History Narrative  . Not on file   Social History   Social History Narrative  . Not on file     ROS: Negative.     PE: HEENT: Negative. Lungs: Clear. Cardio: RR.  Assessment/Plan:  Proceed with planned endoscopy.  Daniel Ryan 11/23/2018

## 2018-11-24 ENCOUNTER — Encounter: Payer: Self-pay | Admitting: General Surgery

## 2018-11-24 LAB — SURGICAL PATHOLOGY

## 2018-11-24 NOTE — Anesthesia Postprocedure Evaluation (Signed)
Anesthesia Post Note  Patient: Daniel Ryan  Procedure(s) Performed: COLONOSCOPY WITH PROPOFOL (N/A )  Anesthesia Type: General     Last Vitals:  Vitals:   11/23/18 0841 11/23/18 0909  BP: 100/65 133/74  Pulse:    Resp: 14   Temp: (!) 36.2 C   SpO2:      Last Pain:  Vitals:   11/23/18 0909  TempSrc:   PainSc: 0-No pain                 Molli Barrows

## 2018-11-24 NOTE — Anesthesia Postprocedure Evaluation (Signed)
Anesthesia Post Note  Patient: Daniel Ryan  Procedure(s) Performed: COLONOSCOPY WITH PROPOFOL (N/A )  Anesthesia Post Evaluation   Last Vitals:  Vitals:   11/23/18 0841 11/23/18 0909  BP: 100/65 133/74  Pulse:    Resp: 14   Temp: (!) 36.2 C   SpO2:      Last Pain:  Vitals:   11/23/18 0909  TempSrc:   PainSc: 0-No pain                 Molli Barrows

## 2019-01-28 NOTE — Progress Notes (Deleted)
[place]  Telephone:(336) [phone] Fax:(336) [phone]  ID: Daniel Ryan OB: 08/27/1945  MR#: [id number]  [id number]  Patient Care Team: Dion Body, MD as PCP - General (Family Medicine)  CHIEF COMPLAINT: Prostate cancer/high-grade PIN  INTERVAL HISTORY: Patient agreed to evaluation and discussion of his laboratory work by telephone.  He continues to feel well and remains asymptomatic.  He remains active.  He denies any pain.  He has no neurologic complaints.  He has a good appetite and denies weight loss.  He denies any chest pain, shortness of breath, cough, or hemoptysis.  He denies any nausea, vomiting, constipation, or diarrhea.  He has no urinary complaints.  Patient feels at his baseline offers no specific complaints today.  REVIEW OF SYSTEMS:   Review of Systems  Constitutional: Negative.  Negative for fever, malaise/fatigue and weight loss.  Respiratory: Negative.  Negative for cough and shortness of breath.   Cardiovascular: Negative.  Negative for chest pain and leg swelling.  Gastrointestinal: Negative.  Negative for abdominal pain.  Genitourinary: Negative.  Negative for dysuria, frequency and hematuria.  Musculoskeletal: Negative.  Negative for back pain.  Skin: Negative.  Negative for rash.  Neurological: Negative.  Negative for sensory change, focal weakness, weakness and headaches.  Psychiatric/Behavioral: Negative.  The patient is not nervous/anxious.     As per HPI. Otherwise, a complete review of systems is negative.  PAST MEDICAL HISTORY: Past Medical History:  Diagnosis Date  . Borderline diabetes   . Prostate cancer ([place]) 07/2017    PAST SURGICAL HISTORY: Past Surgical History:  Procedure Laterality Date  . COLONOSCOPY    . COLONOSCOPY WITH PROPOFOL N/A 09/13/2016   Procedure: COLONOSCOPY WITH PROPOFOL;  Surgeon: Lollie Sails, MD;  Location: [facility] ENDOSCOPY;  Service: Endoscopy;  Laterality: N/A;  . COLONOSCOPY  WITH PROPOFOL N/A 11/23/2018   Procedure: COLONOSCOPY WITH PROPOFOL;  Surgeon: Robert Bellow, MD;  Location: [facility] ENDOSCOPY;  Service: Endoscopy;  Laterality: N/A;  . FLEXIBLE SIGMOIDOSCOPY    . prostate biopsies    . SKIN CANCER EXCISION     dr dasher    FAMILY HISTORY: Family History  Problem Relation Age of Onset  . Hypertension Mother   . Heart attack Father   . Lung cancer Father   . Diabetes Brother   . Bladder Cancer Neg Hx   . Kidney cancer Neg Hx   . Prostate cancer Neg Hx     ADVANCED DIRECTIVES (Y/N):  N  HEALTH MAINTENANCE: Social History   Tobacco Use  . Smoking status: Former Smoker    Packs/day: 2.00    Years: 20.00    Pack years: 40.00    Types: Cigarettes    Quit date: 10/12/1981    Years since quitting: 37.3  . Smokeless tobacco: Never Used  Substance Use Topics  . Alcohol use: Yes    Alcohol/week: 1.0 - 6.0 standard drinks    Types: 1 - 6 Cans of beer per week    Comment: 1-6 per day -varies  . Drug use: No     Colonoscopy:  PAP:  Bone density:  Lipid panel:  No Known Allergies  Current Outpatient Medications  Medication Sig Dispense Refill  . acetaminophen (TYLENOL) 500 MG tablet Take 500 mg by mouth every 6 (six) hours as needed.    . clobetasol cream (TEMOVATE) AB-123456789 % Apply 1 application topically as needed.     . DENTA 5000 PLUS 1.1 % CREA dental cream USE ONCE TO TWICE PER  DAY ATLEAST ONCE BEFORE BEDTIME    . finasteride (PROSCAR) 5 MG tablet Take 1 tablet (5 mg total) by mouth daily. 90 tablet 3  . GAVILYTE-N WITH FLAVOR PACK 420 g solution TAKE 4,000 MLS BY MOUTH ONCE FOR 1 DOSE USE AS DIRECTED FOR COLONOSCOPY    . ibuprofen (ADVIL) 200 MG tablet Take by mouth.    . loratadine (CLARITIN) 10 MG tablet Take 10 mg by mouth daily.    . naproxen sodium (ALEVE) 220 MG tablet Take 220 mg by mouth daily as needed.    . traZODone (DESYREL) 50 MG tablet Take 50 mg by mouth at bedtime.     No current facility-administered medications for  this visit.    OBJECTIVE: There were no vitals filed for this visit.   There is no height or weight on file to calculate BMI.    ECOG FS:0 - Asymptomatic  LAB RESULTS:  No results found for: NA, K, CL, CO2, GLUCOSE, BUN, CREATININE, CALCIUM, PROT, ALBUMIN, AST, ALT, ALKPHOS, BILITOT, GFRNONAA, GFRAA  No results found for: WBC, NEUTROABS, HGB, HCT, MCV, PLT   STUDIES: No results found.  ASSESSMENT: Prostate cancer/high-grade PIN  PLAN:    1. Prostate cancer/high-grade PIN: Patient's PSA remains elevated, but essentially stable ranging between 15 and 21 since August 2019. Nuclear medicine bone scan completed on August 19, 2017 revealed increased activity over the lower thoracic and mid lumbar spine.  These are nonspecific, although could be representative of metastatic disease.  Patient has had multiple prostate biopsies in October 2009, December 2010, and February 2012.  All biopsies returned benign pathology.  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 Pathology was sent to [facility] for second opinion who concurred with the diagnosis. Prostate MRI in March 2016 revealed no findings suggestive of malignancy.  Patient has declined any further biopsies.  Treatment with Lupron was previously discussed, but patient declined.  Continue active surveillance.  No further imaging or biopsies are necessary unless there is suspicion of progression of disease.  Continue follow-up with urology as scheduled.  Return to clinic in 6 months with repeat laboratory work and further evaluation.    Patient expressed understanding and was in agreement with this plan. He also understands that He can call clinic at any time with any questions, concerns, or complaints.   Cancer Staging No matching staging information was found for the patient.  [name], MD   01/28/2019 10:07 AM

## 2019-01-30 ENCOUNTER — Other Ambulatory Visit: Payer: Self-pay

## 2019-01-30 ENCOUNTER — Inpatient Hospital Stay: Payer: Medicare Other | Attending: Oncology

## 2019-02-01 ENCOUNTER — Inpatient Hospital Stay: Payer: Medicare Other | Admitting: Oncology

## 2019-02-01 DIAGNOSIS — C61 Malignant neoplasm of prostate: Secondary | ICD-10-CM

## 2019-02-02 ENCOUNTER — Other Ambulatory Visit: Payer: Medicare Other

## 2019-02-06 ENCOUNTER — Ambulatory Visit: Payer: Medicare Other | Attending: Internal Medicine

## 2019-02-06 DIAGNOSIS — Z20822 Contact with and (suspected) exposure to covid-19: Secondary | ICD-10-CM

## 2019-02-07 ENCOUNTER — Telehealth: Payer: Self-pay | Admitting: *Deleted

## 2019-02-07 LAB — NOVEL CORONAVIRUS, NAA: SARS-CoV-2, NAA: NOT DETECTED

## 2019-02-07 NOTE — Telephone Encounter (Signed)
Patient called for results ,still pending . 

## 2019-02-08 ENCOUNTER — Telehealth: Payer: Self-pay

## 2019-02-08 NOTE — Telephone Encounter (Signed)
Caller given negative result and verbalized understanding  

## 2019-02-22 ENCOUNTER — Other Ambulatory Visit: Payer: Self-pay

## 2019-02-22 ENCOUNTER — Inpatient Hospital Stay: Payer: Medicare Other | Attending: Oncology

## 2019-02-22 DIAGNOSIS — C61 Malignant neoplasm of prostate: Secondary | ICD-10-CM

## 2019-02-22 LAB — PSA: Prostatic Specific Antigen: 21.4 ng/mL — ABNORMAL HIGH (ref 0.00–4.00)

## 2019-02-23 ENCOUNTER — Other Ambulatory Visit: Payer: Medicare Other

## 2019-02-24 NOTE — Progress Notes (Signed)
[place]  Telephone:(336) [phone] Fax:(336) [phone]  ID: Daniel Ryan OB: 1945-12-23  MR#: [id number]  [id number]  Patient Care Team: Dion Body, MD as PCP - General (Family Medicine)  I connected with Daniel Ryan on 03/02/19 at 10:30 AM EST by video enabled telemedicine visit and verified that I am speaking with the correct person using two identifiers.   I discussed the limitations, risks, security and privacy concerns of performing an evaluation and management service by telemedicine and the availability of in-person appointments. I also discussed with the patient that there may be a patient responsible charge related to this service. The patient expressed understanding and agreed to proceed.   Other persons participating in the visit and their role in the encounter: Patient, MD.  Patient's location: Home. Provider's location: Clinic.  CHIEF COMPLAINT: Prostate cancer/high-grade PIN  INTERVAL HISTORY: Patient agreed to video enabled telemedicine visit for further evaluation and discussion of his laboratory work.  He continues to feel well and remains asymptomatic.  He continues to remain active.  He denies any pain.  He has no neurologic complaints.  He has a good appetite and denies weight loss.  He denies any chest pain, shortness of breath, cough, or hemoptysis.  He denies any nausea, vomiting, constipation, or diarrhea.  He has occasional urinary frequency, but no other complaints.  Patient offers no further specific complaints today.   REVIEW OF SYSTEMS:   Review of Systems  Constitutional: Negative.  Negative for fever, malaise/fatigue and weight loss.  Respiratory: Negative.  Negative for cough and shortness of breath.   Cardiovascular: Negative.  Negative for chest pain and leg swelling.  Gastrointestinal: Negative.  Negative for abdominal pain.  Genitourinary: Positive for frequency. Negative for dysuria and hematuria.    Musculoskeletal: Negative.  Negative for back pain.  Skin: Negative.  Negative for rash.  Neurological: Negative.  Negative for sensory change, focal weakness, weakness and headaches.  Psychiatric/Behavioral: Negative.  The patient is not nervous/anxious.     As per HPI. Otherwise, a complete review of systems is negative.  PAST MEDICAL HISTORY: Past Medical History:  Diagnosis Date  . Borderline diabetes   . Prostate cancer ([place]) 07/2017    PAST SURGICAL HISTORY: Past Surgical History:  Procedure Laterality Date  . COLONOSCOPY    . COLONOSCOPY WITH PROPOFOL N/A 09/13/2016   Procedure: COLONOSCOPY WITH PROPOFOL;  Surgeon: Lollie Sails, MD;  Location: [facility] ENDOSCOPY;  Service: Endoscopy;  Laterality: N/A;  . COLONOSCOPY WITH PROPOFOL N/A 11/23/2018   Procedure: COLONOSCOPY WITH PROPOFOL;  Surgeon: Robert Bellow, MD;  Location: [facility] ENDOSCOPY;  Service: Endoscopy;  Laterality: N/A;  . FLEXIBLE SIGMOIDOSCOPY    . prostate biopsies    . SKIN CANCER EXCISION     dr dasher    FAMILY HISTORY: Family History  Problem Relation Age of Onset  . Hypertension Mother   . Heart attack Father   . Lung cancer Father   . Diabetes Brother   . Bladder Cancer Neg Hx   . Kidney cancer Neg Hx   . Prostate cancer Neg Hx     ADVANCED DIRECTIVES (Y/N):  N  HEALTH MAINTENANCE: Social History   Tobacco Use  . Smoking status: Former Smoker    Packs/day: 2.00    Years: 20.00    Pack years: 40.00    Types: Cigarettes    Quit date: 10/12/1981    Years since quitting: 37.4  . Smokeless tobacco: Never Used  Substance Use Topics  .  Alcohol use: Yes    Alcohol/week: 1.0 - 6.0 standard drinks    Types: 1 - 6 Cans of beer per week    Comment: 1-6 per day -varies  . Drug use: No     Colonoscopy:  PAP:  Bone density:  Lipid panel:  No Known Allergies  Current Outpatient Medications  Medication Sig Dispense Refill  . hydrocortisone 2.5 % cream Apply twice daily for 7 days     . pravastatin (PRAVACHOL) 20 MG tablet Take by mouth.    Marland Kitchen acetaminophen (TYLENOL) 500 MG tablet Take 500 mg by mouth every 6 (six) hours as needed.    . clobetasol cream (TEMOVATE) AB-123456789 % Apply 1 application topically as needed.     . DENTA 5000 PLUS 1.1 % CREA dental cream USE ONCE TO TWICE PER DAY ATLEAST ONCE BEFORE BEDTIME    . finasteride (PROSCAR) 5 MG tablet Take 1 tablet (5 mg total) by mouth daily. 90 tablet 3  . GAVILYTE-N WITH FLAVOR PACK 420 g solution TAKE 4,000 MLS BY MOUTH ONCE FOR 1 DOSE USE AS DIRECTED FOR COLONOSCOPY    . ibuprofen (ADVIL) 200 MG tablet Take by mouth.    . loratadine (CLARITIN) 10 MG tablet Take 10 mg by mouth daily.    . naproxen sodium (ALEVE) 220 MG tablet Take 220 mg by mouth daily as needed.    . traZODone (DESYREL) 50 MG tablet Take 50 mg by mouth at bedtime.     No current facility-administered medications for this visit.    OBJECTIVE: There were no vitals filed for this visit.   There is no height or weight on file to calculate BMI.    ECOG FS:0 - Asymptomatic   General: Well-developed, well-nourished, no acute distress. HEENT: Normocephalic. Neuro: Alert, answering all questions appropriately. Cranial nerves grossly intact. Psych: Normal affect.  LAB RESULTS:  No results found for: NA, K, CL, CO2, GLUCOSE, BUN, CREATININE, CALCIUM, PROT, ALBUMIN, AST, ALT, ALKPHOS, BILITOT, GFRNONAA, GFRAA  No results found for: WBC, NEUTROABS, HGB, HCT, MCV, PLT   STUDIES: No results found.  ASSESSMENT: Prostate cancer/high-grade PIN  PLAN:    1. Prostate cancer/high-grade PIN: Patient's PSA remains elevated, but essentially stable ranging between 15 and 21 since August 2019.  Today's result is 21.4.  Nuclear medicine bone scan completed on August 19, 2017 revealed increased activity over the lower thoracic and mid lumbar spine. These are nonspecific, although could be representative of metastatic disease.  Patient has had multiple prostate biopsies in  October 2009, December 2010, and February 2012.  All biopsies returned benign pathology.  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 Pathology was sent to [facility] for second opinion who concurred with the diagnosis. Prostate MRI in March 2016 revealed no findings suggestive of malignancy.  Patient has declined any further biopsies.  Treatment with Lupron was previously discussed, but patient declined.  Continue active surveillance.  No further imaging or biopsies are necessary unless there is suspicion of progression of disease.  Will continue to alternate visits with urology every 3 months.  Return to clinic in 6 months with repeat laboratory work and video assisted telemedicine visit.  I provided 15 minutes of face-to-face video visit time during this encounter which included chart review. Greater than 50% was spent counseling as documented under my assessment & plan.   Patient expressed understanding and was in agreement with this plan. He also understands that He can call clinic at any time with any questions,  concerns, or complaints.    [name], MD   03/02/2019 10:37 AM

## 2019-03-02 ENCOUNTER — Encounter: Payer: Self-pay | Admitting: Oncology

## 2019-03-02 ENCOUNTER — Inpatient Hospital Stay (HOSPITAL_BASED_OUTPATIENT_CLINIC_OR_DEPARTMENT_OTHER): Payer: Medicare Other | Admitting: Oncology

## 2019-03-02 DIAGNOSIS — C61 Malignant neoplasm of prostate: Secondary | ICD-10-CM | POA: Diagnosis not present

## 2019-04-27 ENCOUNTER — Ambulatory Visit: Payer: Medicare Other | Admitting: Urology

## 2019-04-29 ENCOUNTER — Other Ambulatory Visit: Payer: Self-pay | Admitting: Urology

## 2019-06-15 ENCOUNTER — Other Ambulatory Visit: Payer: Self-pay

## 2019-06-15 ENCOUNTER — Encounter: Payer: Self-pay | Admitting: Urology

## 2019-06-15 ENCOUNTER — Ambulatory Visit (INDEPENDENT_AMBULATORY_CARE_PROVIDER_SITE_OTHER): Payer: Medicare Other | Admitting: Urology

## 2019-06-15 VITALS — BP 142/76 | HR 80 | Ht 70.0 in | Wt 236.0 lb

## 2019-06-15 DIAGNOSIS — C61 Malignant neoplasm of prostate: Secondary | ICD-10-CM

## 2019-06-15 DIAGNOSIS — N400 Enlarged prostate without lower urinary tract symptoms: Secondary | ICD-10-CM

## 2019-06-15 DIAGNOSIS — N5201 Erectile dysfunction due to arterial insufficiency: Secondary | ICD-10-CM | POA: Diagnosis not present

## 2019-06-15 MED ORDER — FINASTERIDE 5 MG PO TABS: 5.0000 mg | ORAL_TABLET | Freq: Every day | ORAL | 3 refills | Status: DC

## 2019-06-15 NOTE — Progress Notes (Signed)
06/15/2019 1:30 PM   Daniel Ryan 1945/09/13 [id number]  Referring provider: Dion Body, MD [place] [facility] [place],  [place] [address]  Chief Complaint  Patient presents with  . Prostate Cancer    Urologic history: 1. 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 -Fusion biopsy 06/2017;PSA 28.4;ROInegative for cancer however right apical biopsy with Gleason 3+4 adenocarcinoma involving 20% submitted tissue -Declined treatment and elected surveillance -Prior negative biopsies 2009, 2010 and 2012 for PSA levels 9.5, 13 and 20 respectively all with benign pathology -Saturation biopsies December 2013 PSA 29.9; 1 core with focus high-grade PIN -Prostate MRI 02/2014 PSA bump 36.7 with no findings suggestive high-grade cancer -Repeat prostate MRI 4/29; PSA 28.4; 27 g prostate with PI-RADS 3 lesion transition zone -Bone scan 08/2017 multiple areas slightly increased activity in spine, ribs and left lower extremity felt unlikely related to prostate cancer   HPI: 74 y.o. male presents for follow-up of prostate cancer.  He has declined definitive treatment for intermediate risk disease.  He is also being followed by Dr. Grayland Ormond in medical oncology and last saw him January 2021 with a relatively stable PSA of 21.4  He denies bothersome lower urinary tract symptoms.  He had contemplated discontinuing finasteride however states he has continued on this medication and has requested a refill.  Recent PSA trend (uncorrected):    PMH: Past Medical History:  Diagnosis Date  . Borderline diabetes   . Prostate cancer ([place]) 07/2017    Surgical History: Past Surgical History:  Procedure Laterality Date  . COLONOSCOPY    . COLONOSCOPY WITH PROPOFOL N/A 09/13/2016   Procedure: COLONOSCOPY WITH PROPOFOL;  Surgeon: Lollie Sails, MD;  Location: [facility] ENDOSCOPY;  Service:  Endoscopy;  Laterality: N/A;  . COLONOSCOPY WITH PROPOFOL N/A 11/23/2018   Procedure: COLONOSCOPY WITH PROPOFOL;  Surgeon: Robert Bellow, MD;  Location: [facility] ENDOSCOPY;  Service: Endoscopy;  Laterality: N/A;  . FLEXIBLE SIGMOIDOSCOPY    . prostate biopsies    . SKIN CANCER EXCISION     dr dasher    Home Medications:  Allergies as of 06/15/2019   No Known Allergies     Medication List       Accurate as of June 15, 2019  1:30 PM. If you have any questions, ask your nurse or doctor.        acetaminophen 500 MG tablet Commonly known as: TYLENOL Take 500 mg by mouth every 6 (six) hours as needed.   clobetasol cream 0.05 % Commonly known as: TEMOVATE Apply 1 application topically as needed.   Denta 5000 Plus 1.1 % Crea dental cream Generic drug: sodium fluoride USE ONCE TO TWICE PER DAY ATLEAST ONCE BEFORE BEDTIME   finasteride 5 MG tablet Commonly known as: PROSCAR Take 1 tablet (5 mg total) by mouth daily.   GaviLyte-N with Flavor Pack 420 g solution Generic drug: polyethylene glycol-electrolytes TAKE 4,000 MLS BY MOUTH ONCE FOR 1 DOSE USE AS DIRECTED FOR COLONOSCOPY   hydrocortisone 2.5 % cream Apply twice daily for 7 days   ibuprofen 200 MG tablet Commonly known as: ADVIL Take by mouth.   loratadine 10 MG tablet Commonly known as: CLARITIN Take 10 mg by mouth daily.   naproxen sodium 220 MG tablet Commonly known as: ALEVE Take 220 mg by mouth daily as needed.   pravastatin 20 MG tablet Commonly known as: PRAVACHOL Take by mouth.   traZODone 50 MG tablet Commonly known as: DESYREL Take 50 mg by mouth at bedtime.  Allergies: No Known Allergies  Family History: Family History  Problem Relation Age of Onset  . Hypertension Mother   . Heart attack Father   . Lung cancer Father   . Diabetes Brother   . Bladder Cancer Neg Hx   . Kidney cancer Neg Hx   . Prostate cancer Neg Hx     Social History:  reports that he quit smoking about 37  years ago. His smoking use included cigarettes. He has a 40.00 pack-year smoking history. He has never used smokeless tobacco. He reports current alcohol use of about 1.0 - 6.0 standard drinks of alcohol per week. He reports that he does not use drugs.   Physical Exam: BP (!) 142/76   Pulse 80   Ht 5\' 10"  (1.778 m)   Wt 236 lb (107 kg)   BMI 33.86 kg/m   Constitutional:  Alert and oriented, No acute distress. HEENT: [place] AT, moist mucus membranes.  Trachea midline, no masses. Cardiovascular: No clubbing, cyanosis, or edema. Respiratory: Normal respiratory effort, no increased work of breathing. GI: Abdomen is soft, nontender, nondistended, no abdominal masses GU: Prostate 35 g, smooth without nodules Lymph: No cervical or inguinal lymphadenopathy. Skin: No rashes, bruises or suspicious lesions. Neurologic: Grossly intact, no focal deficits, moving all 4 extremities. Psychiatric: Normal mood and affect.   Assessment & Plan:    - Prostate cancer He has elected to continue active surveillance.  PSA was checked by medical oncology January 2021.  He has medical oncology follow-up scheduled in July and PSA will be checked at that time.  Follow-up with me 1 year for DRE  -BPH with LUTS No bothersome symptoms on finasteride which was refilled.  - Erectile dysfunction Today he did complain of some difficulty achieving and maintaining an erection.  I discussed PDE 5 inhibitors however he wanted to hold off on treatment and will call back if he changes his mind.   Abbie Sons, [place] [address], [place], [place] [address] [phone]

## 2019-06-29 DIAGNOSIS — I10 Essential (primary) hypertension: Secondary | ICD-10-CM | POA: Insufficient documentation

## 2019-08-30 ENCOUNTER — Inpatient Hospital Stay: Payer: Medicare Other | Attending: Oncology

## 2019-08-30 ENCOUNTER — Other Ambulatory Visit: Payer: Self-pay

## 2019-08-30 DIAGNOSIS — C61 Malignant neoplasm of prostate: Secondary | ICD-10-CM | POA: Diagnosis not present

## 2019-08-30 LAB — PSA: Prostatic Specific Antigen: 18.49 ng/mL — ABNORMAL HIGH (ref 0.00–4.00)

## 2019-09-01 NOTE — Progress Notes (Addendum)
[place]  [phone] Fax:(336) [phone]  ID: Daniel Ryan OB: November 08, 1945  MR#: [id number]  CZY#:[id number]  Patient Care Team: Dion Body, MD as PCP - General (Family Medicine)  I connected with Daniel Ryan on 09/03/19 at 10:45 AM EDT by telephone visit and verified that I am speaking with the correct person using two identifiers.   I discussed the limitations, risks, security and privacy concerns of performing an evaluation and management service by telemedicine and the availability of in-person appointments. I also discussed with the patient that there may be a patient responsible charge related to this service. The patient expressed understanding and agreed to proceed.   Other persons participating in the visit and their role in the encounter: Patient, MD.  Patient's location: Home. Provider's location: Clinic.  CHIEF COMPLAINT: Prostate cancer/high-grade PIN  INTERVAL HISTORY: Patient unable to connect with video visit, therefore patient agreed to evaluation and discussion of his laboratory results via telephone.  He currently feels well and is asymptomatic.  He continues to remain active.  He denies any pain.  He has no neurologic complaints.  He has a good appetite and denies weight loss.  He denies any chest pain, shortness of breath, cough, or hemoptysis.  He denies any nausea, vomiting, constipation, or diarrhea.  He has occasional urinary frequency, but no other complaints.  Patient offers no specific complaints today.  REVIEW OF SYSTEMS:   Review of Systems  Constitutional: Negative.  Negative for fever, malaise/fatigue and weight loss.  Respiratory: Negative.  Negative for cough and shortness of breath.   Cardiovascular: Negative.  Negative for chest pain and leg swelling.  Gastrointestinal: Negative.  Negative for abdominal pain.  Genitourinary: Positive for frequency. Negative for dysuria and hematuria.   Musculoskeletal: Negative.  Negative for back pain.  Skin: Negative.  Negative for rash.  Neurological: Negative.  Negative for sensory change, focal weakness, weakness and headaches.  Psychiatric/Behavioral: Negative.  The patient is not nervous/anxious.     As per HPI. Otherwise, a complete review of systems is negative.  PAST MEDICAL HISTORY: Past Medical History:  Diagnosis Date  . Borderline diabetes   . Prostate cancer ([place]) 07/2017    PAST SURGICAL HISTORY: Past Surgical History:  Procedure Laterality Date  . COLONOSCOPY    . COLONOSCOPY WITH PROPOFOL N/A 09/13/2016   Procedure: COLONOSCOPY WITH PROPOFOL;  Surgeon: Lollie Sails, MD;  Location[facility] ENDOSCOPY;  Service: Endoscopy;  Laterality: N/A;  . COLONOSCOPY WITH PROPOFOL N/A 11/23/2018   Procedure: COLONOSCOPY WITH PROPOFOL;  Surgeon: Robert Bellow, MD;  Location: [facility] ENDOSCOPY;  Service: Endoscopy;  Laterality: N/A;  . FLEXIBLE SIGMOIDOSCOPY    . prostate biopsies    . SKIN CANCER EXCISION     dr dasher    FAMILY HISTORY: Family History  Problem Relation Age of Onset  . Hypertension Mother   . Heart attack Father   . Lung cancer Father   . Diabetes Brother   . Bladder Cancer Neg Hx   . Kidney cancer Neg Hx   . Prostate cancer Neg Hx     ADVANCED DIRECTIVES (Y/N):  N  HEALTH MAINTENANCE: Social History   Tobacco Use  . Smoking status: Former Smoker    Packs/day: 2.00    Years: 20.00    Pack years: 40.00    Types: Cigarettes    Quit date: 10/12/1981    Years since quitting: 37.9  . Smokeless tobacco: Never Used  Vaping Use  . Vaping Use:  Never used  Substance Use Topics  . Alcohol use: Yes    Alcohol/week: 1.0 - 6.0 standard drink    Types: 1 - 6 Cans of beer per week    Comment: 1-6 per day -varies  . Drug use: No     Colonoscopy:  PAP:  Bone density:  Lipid panel:  No Known Allergies  Current Outpatient Medications  Medication Sig Dispense Refill  . acetaminophen  (TYLENOL) 500 MG tablet Take 500 mg by mouth every 6 (six) hours as needed.    . clobetasol cream (TEMOVATE) 5.10 % Apply 1 application topically as needed.     . DENTA 5000 PLUS 1.1 % CREA dental cream USE ONCE TO TWICE PER DAY ATLEAST ONCE BEFORE BEDTIME    . finasteride (PROSCAR) 5 MG tablet Take 1 tablet (5 mg total) by mouth daily. 90 tablet 3  . GAVILYTE-N WITH FLAVOR PACK 420 g solution TAKE 4,000 MLS BY MOUTH ONCE FOR 1 DOSE USE AS DIRECTED FOR COLONOSCOPY    . hydrocortisone 2.5 % cream Apply twice daily for 7 days    . ibuprofen (ADVIL) 200 MG tablet Take by mouth.    . loratadine (CLARITIN) 10 MG tablet Take 10 mg by mouth daily.    Marland Kitchen losartan (COZAAR) 25 MG tablet Take 1 tablet by mouth daily.    . naproxen sodium (ALEVE) 220 MG tablet Take 220 mg by mouth daily as needed.    . pravastatin (PRAVACHOL) 20 MG tablet Take by mouth.    . traZODone (DESYREL) 50 MG tablet Take 50 mg by mouth at bedtime.     No current facility-administered medications for this visit.    OBJECTIVE: There were no vitals filed for this visit.   There is no height or weight on file to calculate BMI.    ECOG FS:0 - Asymptomatic   LAB RESULTS:  No results found for: NA, K, CL, CO2, GLUCOSE, BUN, CREATININE, CALCIUM, PROT, ALBUMIN, AST, ALT, ALKPHOS, BILITOT, GFRNONAA, GFRAA  No results found for: WBC, NEUTROABS, HGB, HCT, MCV, PLT   STUDIES: No results found.  ASSESSMENT: Prostate cancer/high-grade PIN  PLAN:    1. Prostate cancer/high-grade PIN: Patient's PSA remains elevated, but essentially stable ranging between 15 and 21 since August 2019.  His most recent result is 18.49.  Nuclear medicine bone scan completed on August 19, 2017 revealed increased activity over the lower thoracic and mid lumbar spine. These are nonspecific, although could be representative of metastatic disease.  Patient has had multiple prostate biopsies in October 2009, December 2010, and February 2012.  All biopsies returned  benign pathology.  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 Pathology was sent to [facility] for second opinion who concurred with the diagnosis. Prostate MRI in March 2016 revealed no findings suggestive of malignancy.  Patient has declined any further biopsies.  Treatment with Lupron was previously discussed, but patient declined.  Continue active surveillance.  No further imaging or biopsies are necessary unless there is suspicion of progression of disease.  Return to clinic in 6 months with repeat laboratory work and further evaluation.  Patient's next appointment with urology is on June 13, 2020.  After that, patient can follow-up on a yearly basis alternating every 6 months with urology.  I provided 15 minutes of non face-to-face telephone visit time during this encounter which included chart review, counseling, and coordination of care as documented above.   Patient expressed understanding and was in agreement with this plan. He  also understands that He can call clinic at any time with any questions, concerns, or complaints.    [name], MD   09/03/2019 3:40 PM

## 2019-09-03 ENCOUNTER — Encounter: Payer: Self-pay | Admitting: Oncology

## 2019-09-03 ENCOUNTER — Inpatient Hospital Stay (HOSPITAL_BASED_OUTPATIENT_CLINIC_OR_DEPARTMENT_OTHER): Payer: Medicare Other | Admitting: Oncology

## 2019-09-03 DIAGNOSIS — C61 Malignant neoplasm of prostate: Secondary | ICD-10-CM

## 2019-09-03 NOTE — Progress Notes (Signed)
Patient denies any questions or concerns at this time. He states he would like information to not be sent over mychart anymore. He rarely looks at it and has difficulty accessing.

## 2019-09-03 NOTE — Addendum Note (Signed)
Addended by: [name] on: 09/03/2019 03:43 PM   Modules accepted: Level of Service

## 2019-09-13 ENCOUNTER — Telehealth: Payer: Self-pay

## 2019-09-13 NOTE — Telephone Encounter (Signed)
Incoming call from pt on triage line stating that he was told he could have a medication sent to the pharmacy to help with ED. Pt states he would like that medication sent in now. Pharmacy verified and is correct.

## 2019-09-14 MED ORDER — SILDENAFIL CITRATE 20 MG PO TABS: ORAL_TABLET | ORAL | 0 refills | Status: DC

## 2019-09-14 NOTE — Telephone Encounter (Signed)
Patient called triage again inquiring about his ED medication. Please advise.

## 2019-10-24 IMAGING — NM NM BONE WHOLE BODY
2 series · 10 of 10 positions shown · non-contrast
Comparison: No recent prior.

CLINICAL DATA: Prostate cancer.

EXAM:
NUCLEAR MEDICINE WHOLE BODY BONE SCAN
TECHNIQUE: Whole body anterior and posterior images were obtained approximately
3 hours after intravenous injection of radiopharmaceutical.
RADIOPHARMACEUTICALS:  22.36 mCi Rechnetium-66m MDP IV

[Series 1000: statics · 2.40mm/px · 4 acquisitions, 8 frames shown]
[im 1/4]
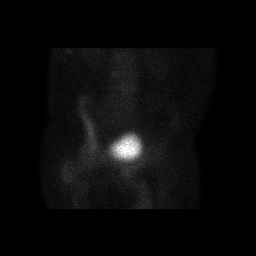
[im 1/4]
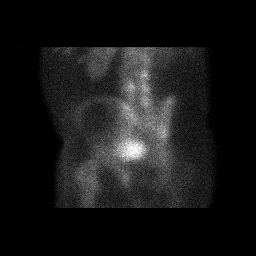
[im 2/4]
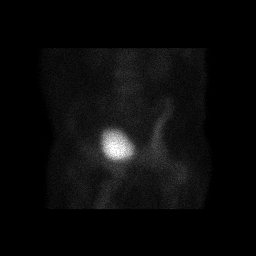
[im 2/4]
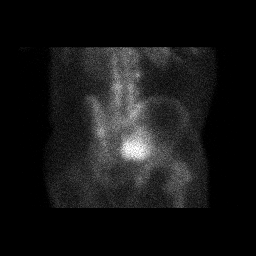
[im 3/4]
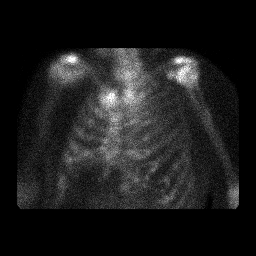
[im 3/4]
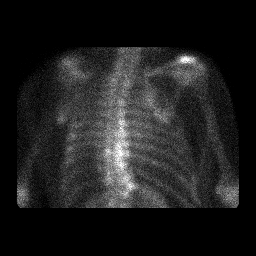
[im 4/4]
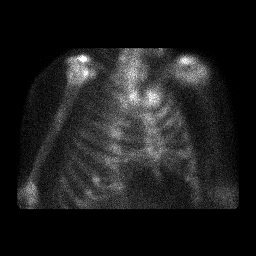
[im 4/4]
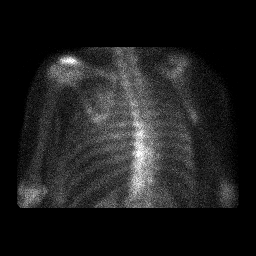

[Series 1000: 3 hr wholebody · 2.40mm/px · 2 of 2 frames shown]
[frame 1/2]
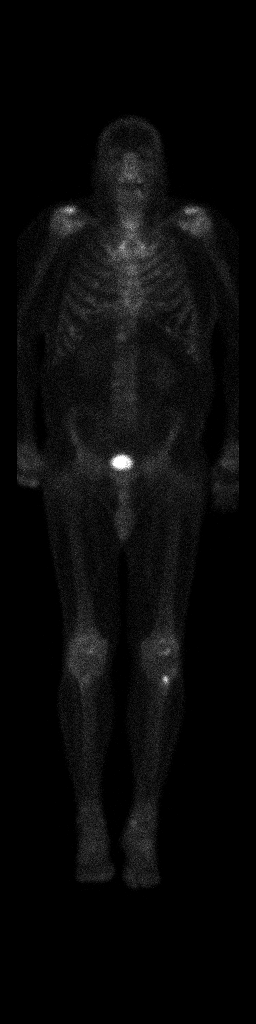
[frame 2/2]
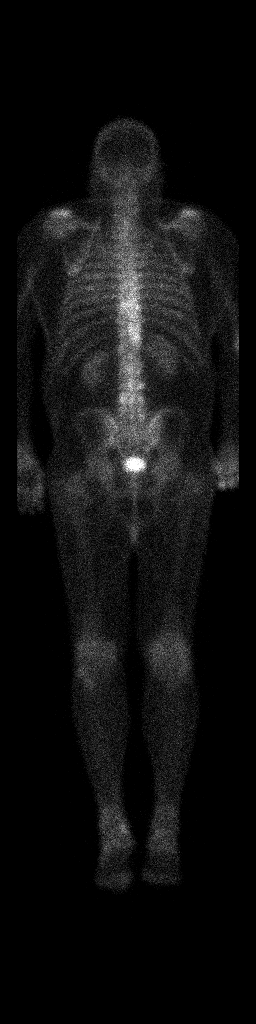

[10 of 10 positions shown; findings below may reference images not displayed]

FINDINGS: Bilateral renal function excretion. Multifocal areas of mild
increased activity noted over the lower thoracic spine and mid
lumbar spine. Although these changes could be degenerative,
metastatic disease could present this fashion. Faint areas of
increased activity noted over anterior posterior ribs bilaterally.
These changes could represent subtle changes of metastatic disease.
Focal area of increased activity noted over the proximal tibia this
could be stress induced. Focal metastatic lesion cannot be excluded.
Degenerative changes noted over both shoulders most likely
degenerative. Degenerative changes noted both knees and ankles most
likely degenerative.
IMPRESSION: 1. Multifocal areas of mild increased activity noted over the lower
thoracic and mid lumbar spine. Faint areas of increased activity
noted over anterior posterior ribs bilaterally. These changes could
be secondary to metastatic disease. Thoracic spine series, lumbar
spine series, and bilateral rib series should be considered for
further evaluation.

2. Focal area of increased activity noted over the proximal tibia.
Although this could be stress induced focal tibial lesion cannot be
excluded. Left lower extremity series can be obtained for further
evaluation.

## 2020-03-01 NOTE — Progress Notes (Deleted)
[name]  Telephone:(336) [phone] [phone]) [phone]  ID: Daniel Ryan OB: 1945-05-21  MR#: [id number]  BHA#:[id number]  Patient Care Team: Dion Body, MD as PCP - General (Family Medicine)   CHIEF COMPLAINT: Prostate cancer/high-grade PIN  INTERVAL HISTORY: Patient unable to connect with video visit, therefore patient agreed to evaluation and discussion of his laboratory results via telephone.  He currently feels well and is asymptomatic.  He continues to remain active.  He denies any pain.  He has no neurologic complaints.  He has a good appetite and denies weight loss.  He denies any chest pain, shortness of breath, cough, or hemoptysis.  He denies any nausea, vomiting, constipation, or diarrhea.  He has occasional urinary frequency, but no other complaints.  Patient offers no specific complaints today.  REVIEW OF SYSTEMS:   Review of Systems  Constitutional: Negative.  Negative for fever, malaise/fatigue and weight loss.  Respiratory: Negative.  Negative for cough and shortness of breath.   Cardiovascular: Negative.  Negative for chest pain and leg swelling.  Gastrointestinal: Negative.  Negative for abdominal pain.  Genitourinary: Positive for frequency. Negative for dysuria and hematuria.  Musculoskeletal: Negative.  Negative for back pain.  Skin: Negative.  Negative for rash.  Neurological: Negative.  Negative for sensory change, focal weakness, weakness and headaches.  Psychiatric/Behavioral: Negative.  The patient is not nervous/anxious.     As per HPI. Otherwise, a complete review of systems is negative.  PAST MEDICAL HISTORY: Past Medical History:  Diagnosis Date  . Borderline diabetes   . Prostate cancer ([place]) 07/2017    PAST SURGICAL HISTORY: Past Surgical History:  Procedure Laterality Date  . COLONOSCOPY    . COLONOSCOPY WITH PROPOFOL N/A 09/13/2016   Procedure: COLONOSCOPY WITH PROPOFOL;  Surgeon: Lollie Sails, MD;   Location[facility] ENDOSCOPY;  Service: Endoscopy;  Laterality: N/A;  . COLONOSCOPY WITH PROPOFOL N/A 11/23/2018   Procedure: COLONOSCOPY WITH PROPOFOL;  Surgeon: Robert Bellow, MD;  Location: [facility] ENDOSCOPY;  Service: Endoscopy;  Laterality: N/A;  . FLEXIBLE SIGMOIDOSCOPY    . prostate biopsies    . SKIN CANCER EXCISION     dr dasher    FAMILY HISTORY: Family History  Problem Relation Age of Onset  . Hypertension Mother   . Heart attack Father   . Lung cancer Father   . Diabetes Brother   . Bladder Cancer Neg Hx   . Kidney cancer Neg Hx   . Prostate cancer Neg Hx     ADVANCED DIRECTIVES (Y/N):  N  HEALTH MAINTENANCE: Social History   Tobacco Use  . Smoking status: Former Smoker    Packs/day: 2.00    Years: 20.00    Pack years: 40.00    Types: Cigarettes    Quit date: 10/12/1981    Years since quitting: 38.4  . Smokeless tobacco: Never Used  Vaping Use  . Vaping Use: Never used  Substance Use Topics  . Alcohol use: Yes    Alcohol/week: 1.0 - 6.0 standard drink    Types: 1 - 6 Cans of beer per week    Comment: 1-6 per day -varies  . Drug use: No     Colonoscopy:  PAP:  Bone density:  Lipid panel:  No Known Allergies  Current Outpatient Medications  Medication Sig Dispense Refill  . acetaminophen (TYLENOL) 500 MG tablet Take 500 mg by mouth every 6 (six) hours as needed.    . clobetasol cream (TEMOVATE) 9.73 % Apply 1 application topically as  needed.     . DENTA 5000 PLUS 1.1 % CREA dental cream USE ONCE TO TWICE PER DAY ATLEAST ONCE BEFORE BEDTIME    . finasteride (PROSCAR) 5 MG tablet Take 1 tablet (5 mg total) by mouth daily. 90 tablet 3  . GAVILYTE-N WITH FLAVOR PACK 420 g solution TAKE 4,000 MLS BY MOUTH ONCE FOR 1 DOSE USE AS DIRECTED FOR COLONOSCOPY    . hydrocortisone 2.5 % cream Apply twice daily for 7 days    . ibuprofen (ADVIL) 200 MG tablet Take by mouth.    . loratadine (CLARITIN) 10 MG tablet Take 10 mg by mouth daily.    Marland Kitchen losartan (COZAAR) 25  MG tablet Take 1 tablet by mouth daily.    . naproxen sodium (ALEVE) 220 MG tablet Take 220 mg by mouth daily as needed.    . pravastatin (PRAVACHOL) 20 MG tablet Take by mouth.    . sildenafil (REVATIO) 20 MG tablet 2-5 tabs 1 hour prior to intercourse 30 tablet 0  . traZODone (DESYREL) 50 MG tablet Take 50 mg by mouth at bedtime.     No current facility-administered medications for this visit.    OBJECTIVE: There were no vitals filed for this visit.   There is no height or weight on file to calculate BMI.    ECOG FS:0 - Asymptomatic   LAB RESULTS:  No results found for: NA, K, CL, CO2, GLUCOSE, BUN, CREATININE, CALCIUM, PROT, ALBUMIN, AST, ALT, ALKPHOS, BILITOT, GFRNONAA, GFRAA  No results found for: WBC, NEUTROABS, HGB, HCT, MCV, PLT   STUDIES: No results found.  ASSESSMENT: Prostate cancer/high-grade PIN  PLAN:    1. Prostate cancer/high-grade PIN: Patient's PSA remains elevated, but essentially stable ranging between 15 and 21 since August 2019.  His most recent result is 18.49.  Nuclear medicine bone scan completed on August 19, 2017 revealed increased activity over the lower thoracic and mid lumbar spine. These are nonspecific, although could be representative of metastatic disease.  Patient has had multiple prostate biopsies in October 2009, December 2010, and February 2012.  All biopsies returned benign pathology.  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 Pathology was sent to [facility] for second opinion who concurred with the diagnosis. Prostate MRI in March 2016 revealed no findings suggestive of malignancy.  Patient has declined any further biopsies.  Treatment with Lupron was previously discussed, but patient declined.  Continue active surveillance.  No further imaging or biopsies are necessary unless there is suspicion of progression of disease.  Return to clinic in 6 months with repeat laboratory work and further evaluation.  Patient's next appointment with  urology is on June 13, 2020.  After that, patient can follow-up on a yearly basis alternating every 6 months with urology.   Patient expressed understanding and was in agreement with this plan. He also understands that He can call clinic at any time with any questions, concerns, or complaints.    [name], MD   03/01/2020 8:22 AM

## 2020-03-03 ENCOUNTER — Telehealth: Payer: Self-pay | Admitting: Oncology

## 2020-03-03 NOTE — Telephone Encounter (Signed)
Patient called and left vm to cancel his appointments on 1/19 and 1/20 rescheduled for sometime in February.  Tried to call patient but had to leave a message.

## 2020-03-05 ENCOUNTER — Inpatient Hospital Stay: Payer: Medicare Other

## 2020-03-06 ENCOUNTER — Inpatient Hospital Stay: Payer: Medicare Other | Admitting: Oncology

## 2020-03-06 DIAGNOSIS — C61 Malignant neoplasm of prostate: Secondary | ICD-10-CM

## 2020-05-02 NOTE — Progress Notes (Signed)
[place]  [phone]) [phone] [phone]) [phone]  ID: Daniel Ryan OB: Jan 17, 1946  MR#: [id number]  HUT#:[id number]  Patient Care Team: Dion Body, MD as PCP - General (Family Medicine)   CHIEF COMPLAINT: Prostate cancer/high-grade PIN  INTERVAL HISTORY: Patient returns to clinic today for repeat laboratory work and routine 31-month evaluation.  He continues to feel well and remains asymptomatic.  He remains active and is traveling to the beach tomorrow for his birthday.  He denies any pain.  He has no neurologic complaints.  He has a good appetite and denies weight loss.  He denies any chest pain, shortness of breath, cough, or hemoptysis.  He denies any nausea, vomiting, constipation, or diarrhea.  He has occasional urinary frequency, but no other complaints.  Patient feels at his baseline and offers no specific complaints today.  REVIEW OF SYSTEMS:   Review of Systems  Constitutional: Negative.  Negative for fever, malaise/fatigue and weight loss.  Respiratory: Negative.  Negative for cough and shortness of breath.   Cardiovascular: Negative.  Negative for chest pain and leg swelling.  Gastrointestinal: Negative.  Negative for abdominal pain.  Genitourinary: Positive for frequency. Negative for dysuria and hematuria.  Musculoskeletal: Negative.  Negative for back pain.  Skin: Negative.  Negative for rash.  Neurological: Negative.  Negative for sensory change, focal weakness, weakness and headaches.  Psychiatric/Behavioral: Negative.  The patient is not nervous/anxious.     As per HPI. Otherwise, a complete review of systems is negative.  PAST MEDICAL HISTORY: Past Medical History:  Diagnosis Date  . Borderline diabetes   . Prostate cancer ([place]) 07/2017    PAST SURGICAL HISTORY: Past Surgical History:  Procedure Laterality Date  . COLONOSCOPY    . COLONOSCOPY WITH PROPOFOL N/A 09/13/2016   Procedure: COLONOSCOPY WITH PROPOFOL;   Surgeon: Lollie Sails, MD;  Location: [facility] ENDOSCOPY;  Service: Endoscopy;  Laterality: N/A;  . COLONOSCOPY WITH PROPOFOL N/A 11/23/2018   Procedure: COLONOSCOPY WITH PROPOFOL;  Surgeon: Robert Bellow, MD;  Location: [facility] ENDOSCOPY;  Service: Endoscopy;  Laterality: N/A;  . FLEXIBLE SIGMOIDOSCOPY    . prostate biopsies    . SKIN CANCER EXCISION     dr dasher    FAMILY HISTORY: Family History  Problem Relation Age of Onset  . Hypertension Mother   . Heart attack Father   . Lung cancer Father   . Diabetes Brother   . Bladder Cancer Neg Hx   . Kidney cancer Neg Hx   . Prostate cancer Neg Hx     ADVANCED DIRECTIVES (Y/N):  N  HEALTH MAINTENANCE: Social History   Tobacco Use  . Smoking status: Former Smoker    Packs/day: 2.00    Years: 20.00    Pack years: 40.00    Types: Cigarettes    Quit date: 10/12/1981    Years since quitting: 38.5  . Smokeless tobacco: Never Used  Vaping Use  . Vaping Use: Never used  Substance Use Topics  . Alcohol use: Yes    Alcohol/week: 1.0 - 6.0 standard drink    Types: 1 - 6 Cans of beer per week    Comment: 1-6 per day -varies  . Drug use: No     Colonoscopy:  PAP:  Bone density:  Lipid panel:  No Known Allergies  Current Outpatient Medications  Medication Sig Dispense Refill  . acetaminophen (TYLENOL) 500 MG tablet Take 500 mg by mouth every 6 (six) hours as needed.    . clobetasol cream (TEMOVATE)  3.29 % Apply 1 application topically as needed.     . DENTA 5000 PLUS 1.1 % CREA dental cream USE ONCE TO TWICE PER DAY ATLEAST ONCE BEFORE BEDTIME    . finasteride (PROSCAR) 5 MG tablet Take 1 tablet (5 mg total) by mouth daily. 90 tablet 3  . ibuprofen (ADVIL) 200 MG tablet Take by mouth.    . loratadine (CLARITIN) 10 MG tablet Take 10 mg by mouth daily.    Marland Kitchen losartan (COZAAR) 25 MG tablet Take 1 tablet by mouth daily.    . sildenafil (REVATIO) 20 MG tablet 2-5 tabs 1 hour prior to intercourse 30 tablet 0  . traZODone  (DESYREL) 50 MG tablet Take 50 mg by mouth at bedtime.    . pravastatin (PRAVACHOL) 20 MG tablet Take by mouth.     No current facility-administered medications for this visit.    OBJECTIVE: Vitals:   05/06/20 1457  BP: 131/76  Pulse: 85  Resp: 20  Temp: 97.8 F (36.6 C)     Body mass index is 34.44 kg/m.    ECOG FS:0 - Asymptomatic   LAB RESULTS:  No results found for: NA, K, CL, CO2, GLUCOSE, BUN, CREATININE, CALCIUM, PROT, ALBUMIN, AST, ALT, ALKPHOS, BILITOT, GFRNONAA, GFRAA  No results found for: WBC, NEUTROABS, HGB, HCT, MCV, PLT   STUDIES: No results found.  ASSESSMENT: Prostate cancer/high-grade PIN  PLAN:    1. Prostate cancer/high-grade PIN: Patient's PSA remains elevated, but essentially stable ranging between 15 and 21 since August 2019.  His most recent PSA has trended up to 27.06.  His most recent imaging was a nuclear medicine bone scan completed on August 19, 2017.  This revealed increased activity over the lower thoracic and mid lumbar spine which were nonspecific.  Patient has had multiple prostate biopsies in October 2009, December 2010, and February 2012.  All biopsies returned benign pathology.  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 Pathology was sent to [facility] for second opinion who concurred with the diagnosis. Prostate MRI in March 2016 revealed no findings suggestive of malignancy.  Patient has declined any further biopsies.  Treatment with Lupron was previously discussed, but patient declined.  We discussed the possibility of repeat imaging, but patient wishes to continue with active surveillance.  He has follow-up with urology in 3 months at which point a PSA will be drawn.  Return to clinic in 6 months with repeat laboratory work and further evaluation.  If his PSA continues to trend up will pursue imaging at that time.   2.  Frequency: Follow-up with urology as above.  I spent a total of 20 minutes reviewing chart data, face-to-face  evaluation with the patient, counseling and coordination of care as detailed above.  Patient expressed understanding and was in agreement with this plan. He also understands that He can call clinic at any time with any questions, concerns, or complaints.    [name], MD   05/06/2020 3:24 PM

## 2020-05-05 ENCOUNTER — Inpatient Hospital Stay: Payer: Medicare Other | Attending: Oncology

## 2020-05-05 DIAGNOSIS — Z79899 Other long term (current) drug therapy: Secondary | ICD-10-CM | POA: Diagnosis not present

## 2020-05-05 DIAGNOSIS — Z7289 Other problems related to lifestyle: Secondary | ICD-10-CM | POA: Insufficient documentation

## 2020-05-05 DIAGNOSIS — Z87891 Personal history of nicotine dependence: Secondary | ICD-10-CM | POA: Insufficient documentation

## 2020-05-05 DIAGNOSIS — R350 Frequency of micturition: Secondary | ICD-10-CM | POA: Diagnosis not present

## 2020-05-05 DIAGNOSIS — C61 Malignant neoplasm of prostate: Secondary | ICD-10-CM | POA: Diagnosis present

## 2020-05-05 DIAGNOSIS — Z801 Family history of malignant neoplasm of trachea, bronchus and lung: Secondary | ICD-10-CM | POA: Insufficient documentation

## 2020-05-05 DIAGNOSIS — Z833 Family history of diabetes mellitus: Secondary | ICD-10-CM | POA: Insufficient documentation

## 2020-05-05 DIAGNOSIS — Z8249 Family history of ischemic heart disease and other diseases of the circulatory system: Secondary | ICD-10-CM | POA: Diagnosis not present

## 2020-05-05 DIAGNOSIS — Z85828 Personal history of other malignant neoplasm of skin: Secondary | ICD-10-CM | POA: Diagnosis not present

## 2020-05-05 LAB — PSA: Prostatic Specific Antigen: 27.06 ng/mL — ABNORMAL HIGH (ref 0.00–4.00)

## 2020-05-06 ENCOUNTER — Encounter: Payer: Self-pay | Admitting: Oncology

## 2020-05-06 ENCOUNTER — Inpatient Hospital Stay (HOSPITAL_BASED_OUTPATIENT_CLINIC_OR_DEPARTMENT_OTHER): Payer: Medicare Other | Admitting: Oncology

## 2020-05-06 VITALS — BP 131/76 | HR 85 | Temp 97.8°F | Resp 20 | Wt 240.0 lb

## 2020-05-06 DIAGNOSIS — C61 Malignant neoplasm of prostate: Secondary | ICD-10-CM | POA: Diagnosis not present

## 2020-05-06 NOTE — Progress Notes (Signed)
Patient denies any concerns today.  

## 2020-06-03 ENCOUNTER — Other Ambulatory Visit: Payer: Self-pay | Admitting: *Deleted

## 2020-06-03 DIAGNOSIS — N400 Enlarged prostate without lower urinary tract symptoms: Secondary | ICD-10-CM

## 2020-06-11 ENCOUNTER — Other Ambulatory Visit: Payer: Self-pay

## 2020-06-11 ENCOUNTER — Other Ambulatory Visit: Payer: Medicare Other

## 2020-06-11 DIAGNOSIS — N400 Enlarged prostate without lower urinary tract symptoms: Secondary | ICD-10-CM

## 2020-06-12 LAB — PSA: Prostate Specific Ag, Serum: 24.4 ng/mL — ABNORMAL HIGH (ref 0.0–4.0)

## 2020-06-13 ENCOUNTER — Ambulatory Visit: Payer: Medicare Other | Admitting: Urology

## 2020-07-09 ENCOUNTER — Ambulatory Visit (INDEPENDENT_AMBULATORY_CARE_PROVIDER_SITE_OTHER): Payer: Medicare Other | Admitting: Urology

## 2020-07-09 ENCOUNTER — Other Ambulatory Visit: Payer: Self-pay

## 2020-07-09 ENCOUNTER — Encounter: Payer: Self-pay | Admitting: Urology

## 2020-07-09 VITALS — BP 174/65 | HR 74 | Ht 70.0 in | Wt 238.0 lb

## 2020-07-09 DIAGNOSIS — C61 Malignant neoplasm of prostate: Secondary | ICD-10-CM

## 2020-07-09 NOTE — Progress Notes (Signed)
07/09/2020 3:23 PM   Daniel Ryan 31-Mar-1945 [id number]  Referring provider: Dion Body, MD [facility] [place],  [place] [address]  Chief Complaint  Patient presents with  . Prostate Cancer    Urologic history: 1. 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 -Fusion biopsy 06/2017;PSA 28.4;ROInegative for cancer however right apical biopsy with Gleason 3+4 adenocarcinoma involving 20% submitted tissue -Declined treatment and elected surveillance -Prior negative biopsies 2009, 2010 and 2012 for PSA levels 9.5, 13 and 20 respectively all with benign pathology -Saturation biopsies December 2013 PSA 29.9; 1 core with focus high-grade PIN -Prostate MRI 02/2014 PSA bump 36.7 with no findings suggestive high-grade cancer -Repeat prostate MRI 4/29; PSA 28.4; 27 g prostate with PI-RADS 3 lesion transition zone -Bone scan 08/2017 multiple areas slightly increased activity in spine, ribs and left lower extremity felt unlikely related to prostate cancer   HPI: 75 y.o. male presents for follow-up of prostate cancer.   No complaints since last visit  Saw Dr. Grayland Ormond in medical oncology March 2022 and PSA increased at 27.06 (uncorrected)  Dr. Grayland Ormond discussed additional imaging however patient declined  He has also declined prostate cancer treatment  No bothersome LUTS  Denies dysuria, gross hematuria  Denies flank, abdominal or pelvic pain  PSA 06/11/2020 24.4 (uncorrected)     PMH: Past Medical History:  Diagnosis Date  . Borderline diabetes   . Prostate cancer ([name]) 07/2017    Surgical History: Past Surgical History:  Procedure Laterality Date  . COLONOSCOPY    . COLONOSCOPY WITH PROPOFOL N/A 09/13/2016   Procedure: COLONOSCOPY WITH PROPOFOL;  Surgeon: Lollie Sails, MD;  Location: [facility] ENDOSCOPY;  Service: Endoscopy;  Laterality: N/A;  . COLONOSCOPY WITH PROPOFOL N/A 11/23/2018   Procedure: COLONOSCOPY WITH PROPOFOL;   Surgeon: Robert Bellow, MD;  Location: [facility] ENDOSCOPY;  Service: Endoscopy;  Laterality: N/A;  . FLEXIBLE SIGMOIDOSCOPY    . prostate biopsies    . SKIN CANCER EXCISION     dr dasher    Home Medications:  Allergies as of 07/09/2020   No Known Allergies     Medication List       Accurate as of Jul 09, 2020  3:23 PM. If you have any questions, ask your nurse or doctor.        acetaminophen 500 MG tablet Commonly known as: TYLENOL Take 500 mg by mouth every 6 (six) hours as needed.   clobetasol cream 0.05 % Commonly known as: TEMOVATE Apply 1 application topically as needed.   Denta 5000 Plus 1.1 % Crea dental cream Generic drug: sodium fluoride USE ONCE TO TWICE PER DAY ATLEAST ONCE BEFORE BEDTIME   finasteride 5 MG tablet Commonly known as: PROSCAR Take 1 tablet (5 mg total) by mouth daily.   ibuprofen 200 MG tablet Commonly known as: ADVIL Take by mouth.   loratadine 10 MG tablet Commonly known as: CLARITIN Take 10 mg by mouth daily.   losartan 25 MG tablet Commonly known as: COZAAR Take 1 tablet by mouth daily.   pravastatin 20 MG tablet Commonly known as: PRAVACHOL Take by mouth.   sildenafil 20 MG tablet Commonly known as: REVATIO 2-5 tabs 1 hour prior to intercourse   traZODone 50 MG tablet Commonly known as: DESYREL Take 50 mg by mouth at bedtime.       Allergies: No Known Allergies  Family History: Family History  Problem Relation Age of Onset  . Hypertension Mother   . Heart attack Father   .  Lung cancer Father   . Diabetes Brother   . Bladder Cancer Neg Hx   . Kidney cancer Neg Hx   . Prostate cancer Neg Hx     Social History:  reports that he quit smoking about 38 years ago. His smoking use included cigarettes. He has a 40.00 pack-year smoking history. He has never used smokeless tobacco. He reports current alcohol use of about 1.0 - 6.0 standard drink of alcohol per week. He reports that he does not use drugs.   Physical  Exam: BP (!) 174/65   Pulse 74   Ht 5\' 10"  (1.778 m)   Wt 238 lb (108 kg)   BMI 34.15 kg/m   Constitutional:  Alert and oriented, No acute distress. HEENT: [place] AT, moist mucus membranes.  Trachea midline, no masses. Cardiovascular: No clubbing, cyanosis, or edema. Respiratory: Normal respiratory effort, no increased work of breathing.    Assessment & Plan:    1.  Prostate cancer (Gleason 3+4)  He has declined treatment and opted for surveillance  Significant PSA increase since July 2021  He states he understands with continued surveillance there is the possibility of developing extracapsular/metastatic disease and indicated he is willing to accept this risk  Recommend PMSA PET/CT for continued rise in [place], MD  [place] [address] [place], [place] [address] [phone]

## 2020-07-10 ENCOUNTER — Encounter: Payer: Self-pay | Admitting: Urology

## 2020-08-24 ENCOUNTER — Other Ambulatory Visit: Payer: Self-pay | Admitting: Urology

## 2020-11-03 NOTE — Progress Notes (Signed)
[place]  Telephone:([phone] Fax:(336) [phone]  ID: Daniel Ryan OB: 03/13/1945  MR#: [id number]  [id number]  Patient Care Team: Daniel Body, MD as PCP - General (Family Medicine)   CHIEF COMPLAINT: Prostate cancer/high-grade PIN  INTERVAL HISTORY: Patient returns to clinic today for repeat laboratory work and routine 87-monthevaluation.  He continues to feel well and remains asymptomatic.  He continues to remain active.  He denies any pain.  He has no neurologic complaints.  He has a good appetite and denies weight loss.  He denies any chest pain, shortness of breath, cough, or hemoptysis.  He denies any nausea, vomiting, constipation, or diarrhea.  He has chronic urinary frequency, but no other complaints.  Patient offers no specific complaints today.  REVIEW OF SYSTEMS:   Review of Systems  Constitutional: Negative.  Negative for fever, malaise/fatigue and weight loss.  Respiratory: Negative.  Negative for cough and shortness of breath.   Cardiovascular: Negative.  Negative for chest pain and leg swelling.  Gastrointestinal: Negative.  Negative for abdominal pain.  Genitourinary:  Positive for frequency. Negative for dysuria and hematuria.  Musculoskeletal: Negative.  Negative for back pain.  Skin: Negative.  Negative for rash.  Neurological: Negative.  Negative for sensory change, focal weakness, weakness and headaches.  Psychiatric/Behavioral: Negative.  The patient is not nervous/anxious.    As per HPI. Otherwise, a complete review of systems is negative.  PAST MEDICAL HISTORY: Past Medical History:  Diagnosis Date   Borderline diabetes    [place]state cancer ([place] 07/2017    PAST SURGICAL HISTORY: Past Surgical History:  Procedure Laterality Date   COLONOSCOPY     COLONOSCOPY WITH PROPOFOL N/A 09/13/2016   Procedure: COLONOSCOPY WITH PROPOFOL;  Surgeon: SLollie Sails MD;  [facility];  Service: Endoscopy;   Laterality: N/A;   COLONOSCOPY WITH PROPOFOL N/A 11/23/2018   Procedure: COLONOSCOPY WITH PROPOFOL;  Surgeon: BRobert Bellow MD;  Location: [facility] ENDOSCOPY;  Service: Endoscopy;  Laterality: N/A;   FLEXIBLE SIGMOIDOSCOPY     prostate biopsies     SKIN CANCER EXCISION     dr dasher    FAMILY HISTORY: Family History  Problem Relation Age of Onset   Hypertension Mother    Heart attack Father    Lung cancer Father    Diabetes Brother    Bladder Cancer Neg Hx    Kidney cancer Neg Hx    Prostate cancer Neg Hx     ADVANCED DIRECTIVES (Y/N):  N  HEALTH MAINTENANCE: Social History   Tobacco Use   Smoking status: Former    Packs/day: 2.00    Years: 20.00    Pack years: 40.00    Types: Cigarettes    Quit date: 10/12/1981    Years since quitting: 39.0   Smokeless tobacco: Never  Vaping Use   Vaping Use: Never used  Substance Use Topics   Alcohol use: Yes    Alcohol/week: 1.0 - 6.0 standard drink    Types: 1 - 6 Cans of beer per week    Comment: 1-6 per day -varies   Drug use: No     Colonoscopy:  PAP:  Bone density:  Lipid panel:  No Known Allergies  Current Outpatient Medications  Medication Sig Dispense Refill   acetaminophen (TYLENOL) 500 MG tablet Take 500 mg by mouth every 6 (six) hours as needed.     clobetasol cream (TEMOVATE) 0AB-123456789% Apply 1 application topically as needed.      DENTA 5000  PLUS 1.1 % CREA dental cream USE ONCE TO TWICE PER DAY ATLEAST ONCE BEFORE BEDTIME     finasteride (PROSCAR) 5 MG tablet TAKE 1 TABLET BY MOUTH EVERY DAY 90 tablet 3   ibuprofen (ADVIL) 200 MG tablet Take by mouth.     loratadine (CLARITIN) 10 MG tablet Take 10 mg by mouth daily.     sildenafil (REVATIO) 20 MG tablet 2-5 tabs 1 hour prior to intercourse 30 tablet 0   traZODone (DESYREL) 50 MG tablet Take 50 mg by mouth at bedtime.     losartan (COZAAR) 25 MG tablet Take 1 tablet by mouth daily.     pravastatin (PRAVACHOL) 20 MG tablet Take by mouth.     No current  facility-administered medications for this visit.    OBJECTIVE: Vitals:   11/05/20 1037  BP: (!) 144/72  Pulse: 74  Resp: 16  Temp: 97.9 F (36.6 C)  SpO2: 99%     Ryan mass index is 34.67 kg/m.    ECOG FS:0 - Asymptomatic   LAB RESULTS:  No results found for: NA, K, CL, CO2, GLUCOSE, BUN, CREATININE, CALCIUM, PROT, ALBUMIN, AST, ALT, ALKPHOS, BILITOT, GFRNONAA, GFRAA  No results found for: WBC, NEUTROABS, HGB, HCT, MCV, PLT   STUDIES: No results found.  ASSESSMENT: Prostate cancer/high-grade PIN  PLAN:    1. Prostate cancer/high-grade PIN: Patient's PSA remains elevated, but essentially stable ranging between 15 and 27.06 since August 2019.  His most recent PSA has decreased to 16.17.  His most recent imaging was a nuclear medicine bone scan completed on August 19, 2017.  This revealed increased activity over the lower thoracic and mid lumbar spine which were nonspecific.  Patient has had multiple prostate biopsies in October 2009, December 2010, and February 2012.  All biopsies returned benign pathology.  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 Pathology was sent to [facility] for second opinion who concurred with the diagnosis. Prostate MRI in March 2016 revealed no findings suggestive of malignancy.  Patient has declined any further biopsies.  Treatment with Lupron was previously discussed, but patient declined.  We discussed the possibility of repeat imaging, but patient wishes to continue with active surveillance.  Continue to alternate visits with urology every 6 months.  Return to clinic in 1 year for repeat laboratory work and further evaluation  2.  Frequency: Follow-up with urology as above.  I spent a total of 20 minutes reviewing chart data, face-to-face evaluation with the patient, counseling and coordination of care as detailed above.   Patient expressed understanding and was in agreement with this plan. He also understands that He can call clinic at  any time with any questions, concerns, or complaints.    Daniel Huger, MD   11/07/2020 12:32 PM

## 2020-11-04 ENCOUNTER — Inpatient Hospital Stay: Payer: Medicare Other | Attending: Oncology

## 2020-11-04 DIAGNOSIS — Z79899 Other long term (current) drug therapy: Secondary | ICD-10-CM | POA: Diagnosis not present

## 2020-11-04 DIAGNOSIS — R350 Frequency of micturition: Secondary | ICD-10-CM | POA: Insufficient documentation

## 2020-11-04 DIAGNOSIS — Z87891 Personal history of nicotine dependence: Secondary | ICD-10-CM | POA: Insufficient documentation

## 2020-11-04 DIAGNOSIS — Z801 Family history of malignant neoplasm of trachea, bronchus and lung: Secondary | ICD-10-CM | POA: Diagnosis not present

## 2020-11-04 DIAGNOSIS — Z8249 Family history of ischemic heart disease and other diseases of the circulatory system: Secondary | ICD-10-CM | POA: Insufficient documentation

## 2020-11-04 DIAGNOSIS — Z85828 Personal history of other malignant neoplasm of skin: Secondary | ICD-10-CM | POA: Diagnosis not present

## 2020-11-04 DIAGNOSIS — Z7289 Other problems related to lifestyle: Secondary | ICD-10-CM | POA: Diagnosis not present

## 2020-11-04 DIAGNOSIS — C61 Malignant neoplasm of prostate: Secondary | ICD-10-CM | POA: Insufficient documentation

## 2020-11-04 DIAGNOSIS — Z833 Family history of diabetes mellitus: Secondary | ICD-10-CM | POA: Insufficient documentation

## 2020-11-04 LAB — PSA: Prostatic Specific Antigen: 16.17 ng/mL — ABNORMAL HIGH (ref 0.00–4.00)

## 2020-11-05 ENCOUNTER — Inpatient Hospital Stay (HOSPITAL_BASED_OUTPATIENT_CLINIC_OR_DEPARTMENT_OTHER): Payer: Medicare Other | Admitting: Oncology

## 2020-11-05 VITALS — BP 144/72 | HR 74 | Temp 97.9°F | Resp 16 | Wt 241.6 lb

## 2020-11-05 DIAGNOSIS — C61 Malignant neoplasm of prostate: Secondary | ICD-10-CM | POA: Diagnosis not present

## 2020-11-05 NOTE — Progress Notes (Signed)
Pt has no concerns/complaints at this time. 

## 2020-12-09 ENCOUNTER — Other Ambulatory Visit: Payer: Self-pay

## 2021-04-20 ENCOUNTER — Other Ambulatory Visit: Payer: Medicare Other

## 2021-04-20 ENCOUNTER — Other Ambulatory Visit: Payer: Self-pay

## 2021-04-20 DIAGNOSIS — C61 Malignant neoplasm of prostate: Secondary | ICD-10-CM

## 2021-04-21 LAB — PSA: Prostate Specific Ag, Serum: 30.9 ng/mL — ABNORMAL HIGH (ref 0.0–4.0)

## 2021-04-23 ENCOUNTER — Other Ambulatory Visit: Payer: Self-pay

## 2021-04-23 ENCOUNTER — Ambulatory Visit (INDEPENDENT_AMBULATORY_CARE_PROVIDER_SITE_OTHER): Payer: Medicare Other | Admitting: Urology

## 2021-04-23 ENCOUNTER — Encounter: Payer: Self-pay | Admitting: Urology

## 2021-04-23 VITALS — BP 151/89 | HR 67 | Ht 70.0 in | Wt 240.0 lb

## 2021-04-23 DIAGNOSIS — C61 Malignant neoplasm of prostate: Secondary | ICD-10-CM | POA: Diagnosis not present

## 2021-04-23 NOTE — Progress Notes (Signed)
? ?04/23/2021 ?2:32 PM  ? ?Daniel Ryan ?[birth year] ?[id number] ? ?Referring provider: Dion Body, MD ?[place] ?[facility] ?[place],  [place] [address] ? ?Chief Complaint  ?Patient presents with  ? Prostate Cancer  ? ? ?Urologic history: ?1. 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 ? -Fusion biopsy 06/2017; PSA 28.4; ROI negative for cancer however right apical biopsy with Gleason 3+4 adenocarcinoma involving 20% submitted tissue ?-Declined treatment and elected surveillance ?-Prior negative biopsies 2009, 2010 and 2012 for PSA levels 9.5, 13 and 20 respectively all with benign pathology ?-Saturation biopsies December 2013 PSA 29.9; 1 core with focus high-grade PIN ?-Prostate MRI 02/2014 PSA bump 36.7 with no findings suggestive high-grade cancer ?-Repeat prostate MRI 4/29; PSA 28.4; 27 g prostate with PI-RADS 3 lesion transition zone ?-Bone scan 08/2017 multiple areas slightly increased activity in spine, ribs and left lower extremity felt unlikely related to prostate cancer ? ? ?HPI: ?76 y.o. male presents for follow-up of prostate cancer. ? ?No complaints since last visit ?Saw Dr. Grayland Ormond in medical oncology March 2022 and PSA increased at 27.06 (uncorrected) ?Dr. Grayland Ormond discussed additional imaging however patient declined ?He has also declined prostate cancer treatment ?No bothersome LUTS ?Denies dysuria, gross hematuria ?Denies flank, abdominal or pelvic pain ?PSA 04/20/2021 30.9 (uncorrected) ? ? ? ? ?PMH: ?Past Medical History:  ?Diagnosis Date  ? Borderline diabetes   ? Prostate cancer ([place]) 07/2017  ? ? ?Surgical History: ?Past Surgical History:  ?Procedure Laterality Date  ? COLONOSCOPY    ? COLONOSCOPY WITH PROPOFOL N/A 09/13/2016  ? Procedure: COLONOSCOPY WITH PROPOFOL;  Surgeon: Lollie Sails, MD;  [facility] ENDOSCOPY;  Service: Endoscopy;  Laterality: N/A;  ? COLONOSCOPY WITH PROPOFOL N/A 11/23/2018  ? Procedure: COLONOSCOPY WITH PROPOFOL;  Surgeon: Robert Bellow, MD;  Location: [facility] ENDOSCOPY;  Service: Endoscopy;  Laterality: N/A;  ? FLEXIBLE SIGMOIDOSCOPY    ? prostate biopsies    ? SKIN CANCER EXCISION    ? dr dasher  ? ? ?Home Medications:  ?Allergies as of 04/23/2021   ?No Known Allergies ?  ? ?  ?Medication List  ?  ? ?  ? Accurate as of April 23, 2021  2:32 PM. If you have any questions, ask your nurse or doctor.  ?  ?  ? ?  ? ?acetaminophen 500 MG tablet ?Commonly known as: TYLENOL ?Take 500 mg by mouth every 6 (six) hours as needed. ?  ?clobetasol cream 0.05 % ?Commonly known as: TEMOVATE ?Apply 1 application topically as needed. ?  ?Denta 5000 Plus 1.1 % Crea dental cream ?Generic drug: sodium fluoride ?USE ONCE TO TWICE PER DAY ATLEAST ONCE BEFORE BEDTIME ?  ?finasteride 5 MG tablet ?Commonly known as: PROSCAR ?TAKE 1 TABLET BY MOUTH EVERY DAY ?  ?ibuprofen 200 MG tablet ?Commonly known as: ADVIL ?Take by mouth. ?  ?loratadine 10 MG tablet ?Commonly known as: CLARITIN ?Take 10 mg by mouth daily. ?  ?losartan 25 MG tablet ?Commonly known as: COZAAR ?Take 1 tablet by mouth daily. ?  ?pravastatin 20 MG tablet ?Commonly known as: PRAVACHOL ?Take by mouth. ?  ?sildenafil 20 MG tablet ?Commonly known as: REVATIO ?2-5 tabs 1 hour prior to intercourse ?  ?traZODone 50 MG tablet ?Commonly known as: DESYREL ?Take 50 mg by mouth at bedtime. ?  ? ?  ? ? ?Allergies: No Known Allergies ? ?Family History: ?Family History  ?Problem Relation Age of Onset  ? Hypertension Mother   ? Heart attack Father   ?  Lung cancer Father   ? Diabetes Brother   ? Bladder Cancer Neg Hx   ? Kidney cancer Neg Hx   ? Prostate cancer Neg Hx   ? ? ?Social History:  reports that he quit smoking about 39 years ago. His smoking use included cigarettes. He has a 40.00 pack-year smoking history. He has never used smokeless tobacco. He reports current alcohol use of about 1.0 - 6.0 standard drink per week. He reports that he does not use drugs. ? ? ?Physical Exam: ?BP (!) 151/89   Pulse 67   Ht 5'  10" (1.778 m)   Wt 240 lb (108.9 kg)   BMI 34.44 kg/m?   ?Constitutional:  Alert and oriented, No acute distress. ?HEENT: [place] AT, moist mucus membranes.  Trachea midline, no masses. ?Cardiovascular: No clubbing, cyanosis, or edema. ?Respiratory: Normal respiratory effort, no increased work of breathing. ? ? ? ?Assessment & Plan:   ? ?1.  Prostate cancer (intermediate risk, Gleason 3+4) ?He has declined treatment and opted for surveillance ?PSA declined in September 2022 the most recent PSA has bumped to 30.9 ?He states he understands with continued surveillance there is the possibility of developing extracapsular/metastatic disease and indicated he is willing to accept this risk ?I again discussed PSMA/PET and he has declined further imaging at this time ?He states he has an appointment scheduled with Dr. Grayland Ormond in 6 months and we will see him back in 1 year with a PSA.  If he changes his mind regarding treatment or further imaging he will call earlier ? ? ?Abbie Sons, MD ? ?[place] ?[address] ?[place], [place] [address] ?[phone] ? ?

## 2021-07-09 ENCOUNTER — Other Ambulatory Visit: Payer: Self-pay

## 2021-07-15 ENCOUNTER — Ambulatory Visit: Payer: Self-pay | Admitting: Urology

## 2021-09-09 ENCOUNTER — Ambulatory Visit
Admission: EM | Admit: 2021-09-09 | Discharge: 2021-09-09 | Disposition: A | Discharge status: Home / Self Care | Payer: Medicare Other | Attending: Family Medicine | Admitting: Family Medicine

## 2021-09-09 DIAGNOSIS — R0982 Postnasal drip: Secondary | ICD-10-CM | POA: Diagnosis not present

## 2021-09-09 DIAGNOSIS — R058 Other specified cough: Secondary | ICD-10-CM

## 2021-09-09 DIAGNOSIS — J0140 Acute pansinusitis, unspecified: Secondary | ICD-10-CM

## 2021-09-09 MED ORDER — BENZONATATE 100 MG PO CAPS: 100.0000 mg | ORAL_CAPSULE | Freq: Three times a day (TID) | ORAL | 0 refills | Status: DC | PRN

## 2021-09-09 MED ORDER — BENZONATATE 100 MG PO CAPS: 200.0000 mg | ORAL_CAPSULE | Freq: Three times a day (TID) | ORAL | 0 refills | Status: DC | PRN

## 2021-09-09 MED ORDER — AMOXICILLIN-POT CLAVULANATE 875-125 MG PO TABS: 1.0000 | ORAL_TABLET | Freq: Two times a day (BID) | ORAL | 0 refills | Status: DC

## 2021-09-09 NOTE — ED Triage Notes (Signed)
Pt presents with sore throat and cough x 4 days. Concern for sinus infection. Cough is productive.

## 2021-09-09 NOTE — ED Provider Notes (Signed)
Daniel Ryan    CSN: [id number] Arrival date & time: 09/09/21  1128      History   Chief Complaint Chief Complaint  Patient presents with   Sore Throat   Cough    HPI Daniel Ryan is a 76 y.o. male.   HPI Patient with a history of prediabetes, hyperlipidemia, prostate cancer presents today with a 4 to 5-day history of nasal congestion, productive cough, and throat soreness.  He also endorses painful cervical lymph nodes bilaterally.  He has not had any shortness of breath or wheezing but endorses a persistent cough which is productive of clear sputum. He has not taken any medication for symptoms.  He has not been in close contact with anyone who has been sick and resides alone in his home. Past Medical History:  Diagnosis Date   Borderline diabetes    Prostate cancer ([place]) 07/2017    Patient Active Problem List   Diagnosis Date Noted   Essential hypertension 06/29/2019   Erectile dysfunction due to arterial insufficiency 06/15/2019   Goals of care, counseling/discussion 07/30/2018   Non morbid obesity due to excess calories 04/13/2018   Pure hypercholesterolemia 04/13/2018   Seasonal allergies 04/13/2018   Type 2 diabetes mellitus with hyperlipidemia ([place]) 04/13/2018   Prostate cancer ([place]) 10/09/2017   BCC (basal cell carcinoma), face 06/10/2016   Encounter for general adult medical examination without abnormal findings 03/26/2016   Vaccine counseling 03/26/2016   Primary insomnia 09/24/2015   Abnormal glucose 10/09/2013   Allergic rhinitis 10/09/2013   Personal history of other specified conditions 10/09/2013   Benign prostatic hyperplasia without lower urinary tract symptoms 12/16/2011   Elevated prostate specific antigen (PSA) 12/16/2011    Past Surgical History:  Procedure Laterality Date   COLONOSCOPY     COLONOSCOPY WITH PROPOFOL N/A 09/13/2016   Procedure: COLONOSCOPY WITH PROPOFOL;  Surgeon: Lollie Sails, MD;  Location: [facility]  ENDOSCOPY;  Service: Endoscopy;  Laterality: N/A;   COLONOSCOPY WITH PROPOFOL N/A 11/23/2018   Procedure: COLONOSCOPY WITH PROPOFOL;  Surgeon: Robert Bellow, MD;  Location: [facility] ENDOSCOPY;  Service: Endoscopy;  Laterality: N/A;   FLEXIBLE SIGMOIDOSCOPY     prostate biopsies     SKIN CANCER EXCISION     dr dasher       Home Medications    Prior to Admission medications   Medication Sig Start Date End Date Taking? Authorizing Provider  acetaminophen (TYLENOL) 500 MG tablet Take 500 mg by mouth every 6 (six) hours as needed.    [provider]  clobetasol cream (TEMOVATE) 5.46 % Apply 1 application topically as needed.     [provider]  DENTA 5000 PLUS 1.1 % CREA dental cream USE ONCE TO TWICE PER DAY ATLEAST ONCE BEFORE BEDTIME 03/13/18   [provider]  finasteride (PROSCAR) 5 MG tablet TAKE 1 TABLET BY MOUTH EVERY DAY 08/25/20   Stoioff, Ronda Fairly, MD  ibuprofen (ADVIL) 200 MG tablet Take by mouth.    [provider]  loratadine (CLARITIN) 10 MG tablet Take 10 mg by mouth daily.    [provider]  losartan (COZAAR) 25 MG tablet Take 1 tablet by mouth daily. 06/29/19 06/28/20  [provider]  pravastatin (PRAVACHOL) 20 MG tablet Take by mouth. 02/26/19 02/26/20  [provider]  sildenafil (REVATIO) 20 MG tablet 2-5 tabs 1 hour prior to intercourse 09/14/19   Stoioff, Ronda Fairly, MD  traZODone (DESYREL) 50 MG tablet Take 50 mg by mouth at bedtime.  [provider]    Family History Family History  Problem Relation Age of Onset   Hypertension Mother    Heart attack Father    Lung cancer Father    Diabetes Brother    Bladder Cancer Neg Hx    Kidney cancer Neg Hx    Prostate cancer Neg Hx     Social History Social History   Tobacco Use   Smoking status: Former    Packs/day: 2.00    Years: 20.00    Total pack years: 40.00    Types: Cigarettes    Quit date: 10/12/1981    Years since quitting: 39.9    Smokeless tobacco: Never  Vaping Use   Vaping Use: Never used  Substance Use Topics   Alcohol use: Yes    Alcohol/week: 1.0 - 6.0 standard drink of alcohol    Types: 1 - 6 Cans of beer per week    Comment: 1-6 per day -varies   Drug use: No     Allergies   Patient has no known allergies.  Review of Systems Review of Systems Pertinent negatives listed in HPI   Physical Exam Triage Vital Signs ED Triage Vitals  Enc Vitals Group     BP 09/09/21 1146 127/78     Pulse Rate 09/09/21 1146 94     Resp 09/09/21 1146 19     Temp 09/09/21 1146 99.5 F (37.5 C)     Temp src --      SpO2 09/09/21 1146 95 %     Weight --      Height --      Head Circumference --      Peak Flow --      Pain Score 09/09/21 1145 10     Pain Loc --      Pain Edu? --      Excl. in [place]? --    No data found.  Updated Vital Signs BP 127/78   Pulse 94   Temp 99.5 F (37.5 C)   Resp 19   SpO2 95%   Visual Acuity Right Eye Distance:   Left Eye Distance:   Bilateral Distance:    Right Eye Near:   Left Eye Near:    Bilateral Near:     Physical Exam  General Appearance:    Alert, cooperative, no distress  HENT:   Normocephalic, ears normal, nares mucosal edema with congestion, rhinorrhea, oropharynx mild erythema w/o swelling or exudate   Eyes:    PERRL, conjunctiva/corneas clear, EOM's intact       Lungs:     Clear to auscultation bilaterally, respirations unlabored  Heart:    Regular Rate and Rhythm  Neurologic:   Awake, alert, oriented x 3. No apparent focal neurological           defect.      UC Treatments / Results  Labs (all labs ordered are listed, but only abnormal results are displayed) Labs Reviewed - No data to display  EKG   Radiology No results found.  Procedures Procedures (including critical care time)  Medications Ordered in UC Medications - No data to display  Initial Impression / Assessment and Plan / UC Course  I have reviewed the triage vital signs and the  nursing notes.  Pertinent labs & imaging results that were available during my care of the patient were reviewed by me and considered in my medical decision making (see chart for details).    Treatment per discharge medication orders  and instruction. ER precautions given if his symptoms worsen or become severe.  If symptoms do not resolve with completion of course of treatment follow-up with primary care provider return for evaluation. Final Clinical Impressions(s) / UC Diagnoses   Final diagnoses:  Acute non-recurrent pansinusitis  Productive cough  Post-nasal drainage     Discharge Instructions      Take medication as prescribed and complete entire course of antibiotics. Return if your symptoms do not improve following completion of medication.  If at any point your symptoms worsen or you develop shortness of breath, and chest tightness go immediately to the nearest emergency department.   ED Prescriptions   None    PDMP not reviewed this encounter.   Scot Jun, FNP 09/09/21 1245

## 2021-09-09 NOTE — Discharge Instructions (Addendum)
Take medication as prescribed and complete entire course of antibiotics. Return if your symptoms do not improve following completion of medication.  If at any point your symptoms worsen or you develop shortness of breath, and chest tightness go immediately to the nearest emergency department.

## 2021-11-05 ENCOUNTER — Inpatient Hospital Stay: Payer: Medicare Other | Attending: Nurse Practitioner

## 2021-11-05 DIAGNOSIS — R350 Frequency of micturition: Secondary | ICD-10-CM | POA: Insufficient documentation

## 2021-11-05 DIAGNOSIS — C61 Malignant neoplasm of prostate: Secondary | ICD-10-CM | POA: Diagnosis present

## 2021-11-05 DIAGNOSIS — Z79899 Other long term (current) drug therapy: Secondary | ICD-10-CM | POA: Diagnosis not present

## 2021-11-05 DIAGNOSIS — Z87891 Personal history of nicotine dependence: Secondary | ICD-10-CM | POA: Insufficient documentation

## 2021-11-05 LAB — PSA: Prostatic Specific Antigen: 72.37 ng/mL — ABNORMAL HIGH (ref 0.00–4.00)

## 2021-11-06 ENCOUNTER — Other Ambulatory Visit: Payer: Self-pay

## 2021-11-06 ENCOUNTER — Inpatient Hospital Stay (HOSPITAL_BASED_OUTPATIENT_CLINIC_OR_DEPARTMENT_OTHER): Payer: Medicare Other | Admitting: Nurse Practitioner

## 2021-11-06 ENCOUNTER — Encounter: Payer: Self-pay | Admitting: Nurse Practitioner

## 2021-11-06 ENCOUNTER — Other Ambulatory Visit: Payer: Self-pay | Admitting: Oncology

## 2021-11-06 VITALS — BP 141/63 | HR 61 | Temp 98.3°F | Ht 70.0 in | Wt 228.0 lb

## 2021-11-06 DIAGNOSIS — C61 Malignant neoplasm of prostate: Secondary | ICD-10-CM

## 2021-11-06 DIAGNOSIS — R972 Elevated prostate specific antigen [PSA]: Secondary | ICD-10-CM | POA: Diagnosis not present

## 2021-11-06 NOTE — Progress Notes (Addendum)
[name]  Telephone:(336) [phone] Fax:(336) [phone]  ID: Daniel Ryan OB: 1945-07-24  MR#: [id number]  UXL#:[id number]  Patient Care Team: Dion Body, MD as PCP - General (Family Medicine)   CHIEF COMPLAINT: Prostate cancer/high-grade PIN  INTERVAL HISTORY: Patient returns to clinic today for repeat laboratory work and routine 31-monthevaluation.  He continues to feel well and remains asymptomatic.  He continues to remain active.  He denies any pain.  He has no neurologic complaints.  He has a good appetite and denies weight loss.  He denies any chest pain, shortness of breath, cough, or hemoptysis.  He denies any nausea, vomiting, constipation, or diarrhea.  He has chronic urinary frequency, but no other complaints.  Patient offers no specific complaints today.  REVIEW OF SYSTEMS:   Review of Systems  Constitutional: Negative.  Negative for fever, malaise/fatigue and weight loss.  Respiratory: Negative.  Negative for cough and shortness of breath.   Cardiovascular: Negative.  Negative for chest pain and leg swelling.  Gastrointestinal: Negative.  Negative for abdominal pain.  Genitourinary:  Positive for frequency. Negative for dysuria and hematuria.  Musculoskeletal: Negative.  Negative for back pain.  Skin: Negative.  Negative for rash.  Neurological: Negative.  Negative for sensory change, focal weakness, weakness and headaches.  Psychiatric/Behavioral: Negative.  The patient is not nervous/anxious.   As per HPI. Otherwise, a complete review of systems is negative.  PAST MEDICAL HISTORY: Past Medical History:  Diagnosis Date   Borderline diabetes    [place]    PAST SURGICAL HISTORY: Past Surgical History:  Procedure Laterality Date   COLONOSCOPY     COLONOSCOPY WITH PROPOFOL N/A 09/13/2016   Procedure: COLONOSCOPY WITH PROPOFOL;  Surgeon: SLollie Sails MD;  [facility];  Service: Endoscopy;   Laterality: N/A;   COLONOSCOPY WITH PROPOFOL N/A 11/23/2018   Procedure: COLONOSCOPY WITH PROPOFOL;  Surgeon: BRobert Bellow MD;  Location: [facility] ENDOSCOPY;  Service: Endoscopy;  Laterality: N/A;   FLEXIBLE SIGMOIDOSCOPY     prostate biopsies     SKIN CANCER EXCISION     dr dasher    FAMILY HISTORY: Family History  Problem Relation Age of Onset   Hypertension Mother    Heart attack Father    Lung cancer Father    Diabetes Brother    Bladder Cancer Neg Hx    Kidney cancer Neg Hx    Prostate cancer Neg Hx     ADVANCED DIRECTIVES (Y/N):  N  HEALTH MAINTENANCE: Social History   Tobacco Use   Smoking status: Former    Packs/day: 2.00    Years: 20.00    Total pack years: 40.00    Types: Cigarettes    Quit date: 10/12/1981    Years since quitting: 40.0   Smokeless tobacco: Never  Vaping Use   Vaping Use: Never used  Substance Use Topics   Alcohol use: Yes    Alcohol/week: 1.0 - 6.0 standard drink of alcohol    Types: 1 - 6 Cans of beer per week    Comment: 1-6 per day -varies   Drug use: No     Colonoscopy:  PAP:  Bone density:  Lipid panel:  No Known Allergies  Current Outpatient Medications  Medication Sig Dispense Refill   acetaminophen (TYLENOL) 500 MG tablet Take 500 mg by mouth every 6 (six) hours as needed.     clobetasol cream (TEMOVATE) 05.36% Apply 1 application topically as needed.  ibuprofen (ADVIL) 200 MG tablet Take by mouth.     loratadine (CLARITIN) 10 MG tablet Take 10 mg by mouth daily.     losartan (COZAAR) 25 MG tablet Take 1 tablet by mouth daily.     pravastatin (PRAVACHOL) 20 MG tablet Take by mouth.     traZODone (DESYREL) 50 MG tablet Take 50 mg by mouth at bedtime.     amoxicillin-clavulanate (AUGMENTIN) 875-125 MG tablet Take 1 tablet by mouth 2 (two) times daily. 14 tablet 0   benzonatate (TESSALON) 100 MG capsule Take 2 capsules (200 mg total) by mouth 3 (three) times daily as needed for cough. (Patient not taking: Reported on  11/06/2021) 40 capsule 0   DENTA 5000 PLUS 1.1 % CREA dental cream USE ONCE TO TWICE PER DAY ATLEAST ONCE BEFORE BEDTIME (Patient not taking: Reported on 11/06/2021)     finasteride (PROSCAR) 5 MG tablet TAKE 1 TABLET BY MOUTH EVERY DAY (Patient not taking: Reported on 11/06/2021) 90 tablet 3   metFORMIN (GLUCOPHAGE-XR) 500 MG 24 hr tablet SMARTSIG:1 Tablet(s) By Mouth Every Evening     sildenafil (REVATIO) 20 MG tablet 2-5 tabs 1 hour prior to intercourse (Patient not taking: Reported on 11/06/2021) 30 tablet 0   No current facility-administered medications for this visit.    OBJECTIVE: Vitals:   11/06/21 0834  BP: (!) 141/63  Pulse: 61  Temp: 98.3 F (36.8 C)  SpO2: 100%     Body mass index is 32.71 kg/m.    ECOG FS:0 - Asymptomatic   General: Well-developed, well-nourished, no acute distress. Eyes: Pink conjunctiva, anicteric sclera. Lungs: Clear to auscultation bilaterally.  No audible wheezing or coughing Heart: Regular rate and rhythm.  Abdomen: Soft, nontender, nondistended.  Musculoskeletal: No edema, cyanosis, or clubbing. Neuro: Alert, answering all questions appropriately. Cranial nerves grossly intact. Skin: No rashes or petechiae noted. Psych: Normal affect.   LAB RESULTS:     No data to display             No data to display         Lab Results  Component Value Date   PSA1 30.9 (H) 04/20/2021   PSA1 24.4 (H) 06/11/2020   PSA1 19.2 (H) 03/16/2018    STUDIES: No results found.  ASSESSMENT: Prostate cancer/high-grade PIN  PLAN:    1. Prostate cancer/high-grade PIN/Intermediate Risk Prostate cancer cT1c: Patient's PSA has been essentially stable since 2019 ranging between 15 and 27.6 then decreased to 16.17.  He had a nuclear bone medicine scan in July 2019 which revealed increased activity over the lower thoracic and mid lumbar spine which was felt to be nonspecific.  He had multiple prostate biopsies in October 2009, December 2010, and February 2012  which were all benign.  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  Pathology was sent to [facility] for second opinion which concurred with the diagnosis.  Prostate MRI in March 2016 revealed no findings suggestive of malignancy.  He underwent a fusion biopsy at [facility] on 06/30/2017 for PSA of 28.4 and a 2 cm anterior PI-RADS 3 lesion on MRI.  Right apical core showed Gleason 3+4 adenocarcinoma involving 20%.  No evidence of lymphadenopathy or extracapsular extension.  Intermediate risk.  Radical prostatectomy and radiation versus surveillance were discussed.  Despite being intermediate risk, patient elected for surveillance.Previously had discussed Lupron which he declined.  Today, PSA has risen to 72.37.   Recommend PSMA PET to evaluate further. Discussed with Dr. Grayland Ormond who also  advises that starting lupron would be recommended. Reviewed this with patient including rationale, side effects- hot flashes, night sweats, aches, ED. Patient agrees to starting treatment if recommended.  Would recommend starting firmagon 240 mg every 28 days for 4-5 months then likely transition to lupron. He may also be a candidate for possible surgery or radiation based on PET results. Dr. Bernardo Heater copied on note.   2.  Frequency: Follow-up with urology as above.  Addendum: Reached out to Dr Diamantina Providence who feels that surgery will likely curative with PSA level.  Radiation could be an option if no distant metastasis on PSMA/PET   Disposition: Pet scan- psma Lab (cbc, cmp, psa), Dr. Grayland Ormond, +/- new firmagon- la  I spent a total of 45 minutes reviewing chart data, face-to-face evaluation with the patient, counseling and coordination of care as detailed above.   Patient expressed understanding and was in agreement with this plan. He also understands that He can call clinic at any time with any questions, concerns, or complaints.    Verlon Au, NP   11/06/2021   CC: Dr. Bernardo Heater

## 2021-11-06 NOTE — Progress Notes (Signed)
Patient here for oncology follow-up appointment, expresses concerns of dizziness

## 2021-11-22 NOTE — Progress Notes (Deleted)
Daniel Ryan  Telephone:(336) [phone] Fax:(336) [phone]  ID: Daniel Ryan OB: 01-01-1946  MR#: [id number]  XVQ#:[id number]  Patient Care Team: Dion Body, MD as PCP - General (Family Medicine)   CHIEF COMPLAINT: Prostate cancer/high-grade PIN  INTERVAL HISTORY: Patient returns to clinic today for repeat laboratory work and routine 34-monthevaluation.  He continues to feel well and remains asymptomatic.  He continues to remain active.  He denies any pain.  He has no neurologic complaints.  He has a good appetite and denies weight loss.  He denies any chest pain, shortness of breath, cough, or hemoptysis.  He denies any nausea, vomiting, constipation, or diarrhea.  He has chronic urinary frequency, but no other complaints.  Patient offers no specific complaints today.  REVIEW OF SYSTEMS:   Review of Systems  Constitutional: Negative.  Negative for fever, malaise/fatigue and weight loss.  Respiratory: Negative.  Negative for cough and shortness of breath.   Cardiovascular: Negative.  Negative for chest pain and leg swelling.  Gastrointestinal: Negative.  Negative for abdominal pain.  Genitourinary:  Positive for frequency. Negative for dysuria and hematuria.  Musculoskeletal: Negative.  Negative for back pain.  Skin: Negative.  Negative for rash.  Neurological: Negative.  Negative for sensory change, focal weakness, weakness and headaches.  Psychiatric/Behavioral: Negative.  The patient is not nervous/anxious.     As per HPI. Otherwise, a complete review of systems is negative.  PAST MEDICAL HISTORY: Past Medical History:  Diagnosis Date   Borderline diabetes    [place]    PAST SURGICAL HISTORY: Past Surgical History:  Procedure Laterality Date   COLONOSCOPY     COLONOSCOPY WITH PROPOFOL N/A 09/13/2016   Procedure: COLONOSCOPY WITH PROPOFOL;  Surgeon: SLollie Sails MD;  [facility];  Service: Endoscopy;   Laterality: N/A;   COLONOSCOPY WITH PROPOFOL N/A 11/23/2018   Procedure: COLONOSCOPY WITH PROPOFOL;  Surgeon: BRobert Bellow MD;  Location: [facility] ENDOSCOPY;  Service: Endoscopy;  Laterality: N/A;   FLEXIBLE SIGMOIDOSCOPY     prostate biopsies     SKIN CANCER EXCISION     dr dasher    FAMILY HISTORY: Family History  Problem Relation Age of Onset   Hypertension Mother    Heart attack Father    Lung cancer Father    Diabetes Brother    Bladder Cancer Neg Hx    Kidney cancer Neg Hx    Prostate cancer Neg Hx     ADVANCED DIRECTIVES (Y/N):  N  HEALTH MAINTENANCE: Social History   Tobacco Use   Smoking status: Former    Packs/day: 2.00    Years: 20.00    Total pack years: 40.00    Types: Cigarettes    Quit date: 10/12/1981    Years since quitting: 40.1   Smokeless tobacco: Never  Vaping Use   Vaping Use: Never used  Substance Use Topics   Alcohol use: Yes    Alcohol/week: 1.0 - 6.0 standard drink of alcohol    Types: 1 - 6 Cans of beer per week    Comment: 1-6 per day -varies   Drug use: No     Colonoscopy:  PAP:  Bone density:  Lipid panel:  No Known Allergies  Current Outpatient Medications  Medication Sig Dispense Refill   acetaminophen (TYLENOL) 500 MG tablet Take 500 mg by mouth every 6 (six) hours as needed.     amoxicillin-clavulanate (AUGMENTIN) 875-125 MG tablet Take 1 tablet by mouth 2 (two) times daily.  14 tablet 0   benzonatate (TESSALON) 100 MG capsule Take 2 capsules (200 mg total) by mouth 3 (three) times daily as needed for cough. (Patient not taking: Reported on 11/06/2021) 40 capsule 0   clobetasol cream (TEMOVATE) 4.09 % Apply 1 application topically as needed.      DENTA 5000 PLUS 1.1 % CREA dental cream USE ONCE TO TWICE PER DAY ATLEAST ONCE BEFORE BEDTIME (Patient not taking: Reported on 11/06/2021)     finasteride (PROSCAR) 5 MG tablet TAKE 1 TABLET BY MOUTH EVERY DAY (Patient not taking: Reported on 11/06/2021) 90 tablet 3   ibuprofen  (ADVIL) 200 MG tablet Take by mouth.     loratadine (CLARITIN) 10 MG tablet Take 10 mg by mouth daily.     losartan (COZAAR) 25 MG tablet Take 1 tablet by mouth daily.     metFORMIN (GLUCOPHAGE-XR) 500 MG 24 hr tablet SMARTSIG:1 Tablet(s) By Mouth Every Evening     pravastatin (PRAVACHOL) 20 MG tablet Take by mouth.     sildenafil (REVATIO) 20 MG tablet 2-5 tabs 1 hour prior to intercourse (Patient not taking: Reported on 11/06/2021) 30 tablet 0   traZODone (DESYREL) 50 MG tablet Take 50 mg by mouth at bedtime.     No current facility-administered medications for this visit.    OBJECTIVE: There were no vitals filed for this visit.    There is no height or weight on file to calculate BMI.    ECOG FS:0 - Asymptomatic   LAB RESULTS:  No results found for: "NA", "K", "CL", "CO2", "GLUCOSE", "BUN", "CREATININE", "CALCIUM", "PROT", "ALBUMIN", "AST", "ALT", "ALKPHOS", "BILITOT", "GFRNONAA", "GFRAA"  No results found for: "WBC", "NEUTROABS", "HGB", "HCT", "MCV", "PLT"   STUDIES: No results found.  ASSESSMENT: Prostate cancer/high-grade PIN  PLAN:    1. Prostate cancer/high-grade PIN: Patient's PSA remains elevated, but essentially stable ranging between 15 and 27.06 since August 2019.  His most recent PSA has decreased to 16.17.  His most recent imaging was a nuclear medicine bone scan completed on August 19, 2017.  This revealed increased activity over the lower thoracic and mid lumbar spine which were nonspecific.  Patient has had multiple prostate biopsies in October 2009, December 2010, and February 2012.  All biopsies returned benign pathology.  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 Pathology was sent to [facility] for second opinion who concurred with the diagnosis. Prostate MRI in March 2016 revealed no findings suggestive of malignancy.  Patient has declined any further biopsies.  Treatment with Lupron was previously discussed, but patient declined.  We discussed the  possibility of repeat imaging, but patient wishes to continue with active surveillance.  Continue to alternate visits with urology every 6 months.  Return to clinic in 1 year for repeat laboratory work and further evaluation  2.  Frequency: Follow-up with urology as above.  I spent a total of 20 minutes reviewing chart data, face-to-face evaluation with the patient, counseling and coordination of care as detailed above.   Patient expressed understanding and was in agreement with this plan. He also understands that He can call clinic at any time with any questions, concerns, or complaints.    [name], MD   11/22/2021 8:48 AM

## 2021-11-23 ENCOUNTER — Other Ambulatory Visit: Payer: Medicare Other

## 2021-11-24 ENCOUNTER — Ambulatory Visit: Payer: Medicare Other

## 2021-11-26 ENCOUNTER — Ambulatory Visit: Payer: Medicare Other

## 2021-11-26 ENCOUNTER — Other Ambulatory Visit: Payer: Medicare Other

## 2021-11-26 ENCOUNTER — Ambulatory Visit: Payer: Medicare Other | Admitting: Oncology

## 2021-11-26 DIAGNOSIS — C61 Malignant neoplasm of prostate: Secondary | ICD-10-CM

## 2021-11-30 ENCOUNTER — Other Ambulatory Visit: Payer: Medicare Other

## 2021-12-02 ENCOUNTER — Encounter: Payer: Self-pay | Admitting: Oncology

## 2021-12-03 ENCOUNTER — Ambulatory Visit: Payer: Medicare Other

## 2021-12-03 ENCOUNTER — Ambulatory Visit: Payer: Medicare Other | Admitting: Oncology

## 2021-12-03 ENCOUNTER — Other Ambulatory Visit: Payer: Medicare Other

## 2021-12-04 ENCOUNTER — Ambulatory Visit
Admission: RE | Admit: 2021-12-04 | Discharge: 2021-12-04 | Disposition: A | Discharge status: Home / Self Care | Payer: Medicare Other | Source: Ambulatory Visit | Attending: Nurse Practitioner | Admitting: Nurse Practitioner

## 2021-12-04 ENCOUNTER — Ambulatory Visit: Payer: Medicare Other

## 2021-12-04 DIAGNOSIS — C61 Malignant neoplasm of prostate: Secondary | ICD-10-CM | POA: Insufficient documentation

## 2021-12-04 DIAGNOSIS — I2510 Atherosclerotic heart disease of native coronary artery without angina pectoris: Secondary | ICD-10-CM | POA: Insufficient documentation

## 2021-12-04 DIAGNOSIS — I700 Atherosclerosis of aorta: Secondary | ICD-10-CM | POA: Diagnosis not present

## 2021-12-04 MED ORDER — PIFLIFOLASTAT F 18 (PYLARIFY) INJECTION
9.0000 | Freq: Once | INTRAVENOUS | Status: AC
  Administered 2021-12-04: 9.8 via INTRAVENOUS

## 2021-12-08 ENCOUNTER — Inpatient Hospital Stay (HOSPITAL_BASED_OUTPATIENT_CLINIC_OR_DEPARTMENT_OTHER): Payer: Medicare Other | Admitting: Oncology

## 2021-12-08 ENCOUNTER — Inpatient Hospital Stay: Payer: Medicare Other

## 2021-12-08 ENCOUNTER — Encounter: Payer: Self-pay | Admitting: Oncology

## 2021-12-08 ENCOUNTER — Inpatient Hospital Stay: Payer: Medicare Other | Attending: Nurse Practitioner

## 2021-12-08 VITALS — BP 149/73 | HR 57 | Temp 97.3°F | Resp 16 | Ht 70.0 in | Wt 232.0 lb

## 2021-12-08 DIAGNOSIS — Z8249 Family history of ischemic heart disease and other diseases of the circulatory system: Secondary | ICD-10-CM | POA: Insufficient documentation

## 2021-12-08 DIAGNOSIS — C61 Malignant neoplasm of prostate: Secondary | ICD-10-CM | POA: Insufficient documentation

## 2021-12-08 DIAGNOSIS — Z87891 Personal history of nicotine dependence: Secondary | ICD-10-CM | POA: Insufficient documentation

## 2021-12-08 DIAGNOSIS — Z79899 Other long term (current) drug therapy: Secondary | ICD-10-CM | POA: Diagnosis not present

## 2021-12-08 DIAGNOSIS — Z7289 Other problems related to lifestyle: Secondary | ICD-10-CM | POA: Insufficient documentation

## 2021-12-08 DIAGNOSIS — Z85828 Personal history of other malignant neoplasm of skin: Secondary | ICD-10-CM | POA: Diagnosis not present

## 2021-12-08 DIAGNOSIS — Z5111 Encounter for antineoplastic chemotherapy: Secondary | ICD-10-CM | POA: Insufficient documentation

## 2021-12-08 DIAGNOSIS — R350 Frequency of micturition: Secondary | ICD-10-CM | POA: Insufficient documentation

## 2021-12-08 DIAGNOSIS — Z801 Family history of malignant neoplasm of trachea, bronchus and lung: Secondary | ICD-10-CM | POA: Insufficient documentation

## 2021-12-08 DIAGNOSIS — Z833 Family history of diabetes mellitus: Secondary | ICD-10-CM | POA: Insufficient documentation

## 2021-12-08 LAB — COMPREHENSIVE METABOLIC PANEL
ALT: 21 U/L (ref 0–44)
AST: 22 U/L (ref 15–41)
Albumin: 4.2 g/dL (ref 3.5–5.0)
Alkaline Phosphatase: 54 U/L (ref 38–126)
Anion gap: 5 (ref 5–15)
BUN: 15 mg/dL (ref 8–23)
CO2: 27 mmol/L (ref 22–32)
Calcium: 9.2 mg/dL (ref 8.9–10.3)
Chloride: 104 mmol/L (ref 98–111)
Creatinine, Ser: 1.14 mg/dL (ref 0.61–1.24)
GFR, Estimated: >60 mL/min (ref >60)
Glucose, Bld: 166 mg/dL — ABNORMAL HIGH (ref 70–99)
Potassium: 4.6 mmol/L (ref 3.5–5.1)
Sodium: 136 mmol/L (ref 135–145)
Total Bilirubin: 0.8 mg/dL (ref 0.3–1.2)
Total Protein: 7.5 g/dL (ref 6.5–8.1)

## 2021-12-08 LAB — CBC
HCT: 46.1 % (ref 39.0–52.0)
Hemoglobin: 15.4 g/dL (ref 13.0–17.0)
MCH: 30 pg (ref 26.0–34.0)
MCHC: 33.4 g/dL (ref 30.0–36.0)
MCV: 89.9 fL (ref 80.0–100.0)
Platelets: 225 10*3/uL (ref 150–400)
RBC: 5.13 MIL/uL (ref 4.22–5.81)
RDW: 12.9 % (ref 11.5–15.5)
WBC: 6.8 10*3/uL (ref 4.0–10.5)
nRBC: 0 % (ref 0.0–0.2)

## 2021-12-08 LAB — PSA: Prostatic Specific Antigen: 59.17 ng/mL — ABNORMAL HIGH (ref 0.00–4.00)

## 2021-12-08 MED ORDER — DEGARELIX ACETATE(240 MG DOSE) 120 MG/VIAL ~~LOC~~ SOLR
240.0000 mg | Freq: Once | SUBCUTANEOUS | Status: AC
  Administered 2021-12-08: 240 mg via SUBCUTANEOUS
  Filled 2021-12-08: qty 6

## 2021-12-08 NOTE — Addendum Note (Signed)
Addended by: Delice Bison E on: 12/08/2021 02:51 PM   Modules accepted: Orders

## 2021-12-08 NOTE — Progress Notes (Signed)
[place]  Telephone:(336) [phone] Fax:(336) [phone]  ID: Daniel Ryan OB: 03/18/1945  MR#: [id number]  HYQ#:[id number]  Patient Care Team: Dion Body, MD as PCP - General (Family Medicine)   CHIEF COMPLAINT: Prostate cancer/high-grade PIN  INTERVAL HISTORY: Patient returns to clinic today for further evaluation, discussion of his PET scan results, and initiation of Firmagon.  He continues to feel well and remains asymptomatic.  He continues to remain active. He denies any pain.  He has no neurologic complaints.  He has a good appetite and denies weight loss.  He denies any chest pain, shortness of breath, cough, or hemoptysis.  He denies any nausea, vomiting, constipation, or diarrhea.  He has chronic urinary frequency, but no other complaints.  Patient offers no specific complaints today.  REVIEW OF SYSTEMS:   Review of Systems  Constitutional: Negative.  Negative for fever, malaise/fatigue and weight loss.  Respiratory: Negative.  Negative for cough and shortness of breath.   Cardiovascular: Negative.  Negative for chest pain and leg swelling.  Gastrointestinal: Negative.  Negative for abdominal pain.  Genitourinary:  Positive for frequency. Negative for dysuria and hematuria.  Musculoskeletal: Negative.  Negative for back pain.  Skin: Negative.  Negative for rash.  Neurological: Negative.  Negative for sensory change, focal weakness, weakness and headaches.  Psychiatric/Behavioral: Negative.  The patient is not nervous/anxious.     As per HPI. Otherwise, a complete review of systems is negative.  PAST MEDICAL HISTORY: Past Medical History:  Diagnosis Date   Borderline diabetes    Prostate cancer ([place]) 07/2017    PAST SURGICAL HISTORY: Past Surgical History:  Procedure Laterality Date   COLONOSCOPY     COLONOSCOPY WITH PROPOFOL N/A 09/13/2016   Procedure: COLONOSCOPY WITH PROPOFOL;  Surgeon: Lollie Sails, MD;  Location: [facility]  ENDOSCOPY;  Service: Endoscopy;  Laterality: N/A;   COLONOSCOPY WITH PROPOFOL N/A 11/23/2018   Procedure: COLONOSCOPY WITH PROPOFOL;  Surgeon: Robert Bellow, MD;  Location: [facility] ENDOSCOPY;  Service: Endoscopy;  Laterality: N/A;   FLEXIBLE SIGMOIDOSCOPY     prostate biopsies     SKIN CANCER EXCISION     dr dasher    FAMILY HISTORY: Family History  Problem Relation Age of Onset   Hypertension Mother    Heart attack Father    Lung cancer Father    Diabetes Brother    Bladder Cancer Neg Hx    Kidney cancer Neg Hx    Prostate cancer Neg Hx     ADVANCED DIRECTIVES (Y/N):  N  HEALTH MAINTENANCE: Social History   Tobacco Use   Smoking status: Former    Packs/day: 2.00    Years: 20.00    Total pack years: 40.00    Types: Cigarettes    Quit date: 10/12/1981    Years since quitting: 40.1   Smokeless tobacco: Never  Vaping Use   Vaping Use: Never used  Substance Use Topics   Alcohol use: Yes    Alcohol/week: 1.0 - 6.0 standard drink of alcohol    Types: 1 - 6 Cans of beer per week    Comment: 1-6 per day -varies   Drug use: No     Colonoscopy:  PAP:  Bone density:  Lipid panel:  No Known Allergies  Current Outpatient Medications  Medication Sig Dispense Refill   acetaminophen (TYLENOL) 500 MG tablet Take 500 mg by mouth every 6 (six) hours as needed.     ibuprofen (ADVIL) 200 MG tablet Take by mouth.  loratadine (CLARITIN) 10 MG tablet Take 10 mg by mouth daily.     metFORMIN (GLUCOPHAGE-XR) 500 MG 24 hr tablet SMARTSIG:1 Tablet(s) By Mouth Every Evening     sildenafil (REVATIO) 20 MG tablet 2-5 tabs 1 hour prior to intercourse 30 tablet 0   traZODone (DESYREL) 50 MG tablet Take 50 mg by mouth at bedtime.     losartan (COZAAR) 25 MG tablet Take 1 tablet by mouth daily.     pravastatin (PRAVACHOL) 20 MG tablet Take by mouth.     No current facility-administered medications for this visit.    OBJECTIVE: Vitals:   12/08/21 1053  BP: (!) 149/73  Pulse: (!)  57  Resp: 16  Temp: (!) 97.3 F (36.3 C)  SpO2: 100%     Body mass index is 33.29 kg/m.    ECOG FS:0 - Asymptomatic   General: Well-developed, well-nourished, no acute distress. Eyes: Pink conjunctiva, anicteric sclera. HEENT: Normocephalic, moist mucous membranes. Lungs: No audible wheezing or coughing. Heart: Regular rate and rhythm. Abdomen: Soft, nontender, no obvious distention. Musculoskeletal: No edema, cyanosis, or clubbing. Neuro: Alert, answering all questions appropriately. Cranial nerves grossly intact. Skin: No rashes or petechiae noted. Psych: Normal affect.  LAB RESULTS:  Lab Results  Component Value Date   NA 136 12/08/2021   K 4.6 12/08/2021   CL 104 12/08/2021   CO2 27 12/08/2021   GLUCOSE 166 (H) 12/08/2021   BUN 15 12/08/2021   CREATININE 1.14 12/08/2021   CALCIUM 9.2 12/08/2021   PROT 7.5 12/08/2021   ALBUMIN 4.2 12/08/2021   AST 22 12/08/2021   ALT 21 12/08/2021   ALKPHOS 54 12/08/2021   BILITOT 0.8 12/08/2021   GFRNONAA >60 12/08/2021    Lab Results  Component Value Date   WBC 6.8 12/08/2021   HGB 15.4 12/08/2021   HCT 46.1 12/08/2021   MCV 89.9 12/08/2021   PLT 225 12/08/2021     STUDIES: NM PET (PSMA) SKULL TO MID THIGH  Result Date: 12/06/2021 CLINICAL DATA:  Prostate carcinoma. EXAM: NUCLEAR MEDICINE PET SKULL BASE TO THIGH TECHNIQUE: 9.8 mCi F18 Piflufolastat (Pylarify) was injected intravenously. Full-ring PET imaging was performed from the skull base to thigh after the radiotracer. CT data was obtained and used for attenuation correction and anatomic localization. COMPARISON:  None Available. FINDINGS: NECK No radiotracer activity in neck lymph nodes. Incidental CT finding: None. CHEST No radiotracer accumulation within mediastinal or hilar lymph nodes. No suspicious pulmonary nodules on the CT scan. Incidental CT finding: Aortic and coronary atherosclerotic calcification incidentally noted. ABDOMEN/PELVIS Prostate: Large area of  marked radiotracer accumulation is seen involving the central prostate gland bilaterally, with SUV max of 21.8. This consistent with prostate carcinoma. Lymph nodes: No abnormal radiotracer accumulation within pelvic or abdominal nodes. Liver: No evidence of liver metastasis. Incidental CT finding: Aortic atherosclerotic calcification incidentally noted. SKELETON No focal activity to suggest skeletal metastasis. IMPRESSION: Large area of marked radiotracer accumulation in the central prostate gland, consistent with prostate carcinoma. No evidence of local or distant metastatic disease. Aortic Atherosclerosis (ICD10-I70.0). Electronically Signed   By: Marlaine Hind M.D.   On: 12/06/2021 10:26    ONCOLOGY HISTORY: Patient has had multiple prostate biopsies in October 2009, December 2010, and February 2012.  All biopsies returned benign pathology.  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 Pathology was sent to [facility] for second opinion who concurred with the diagnosis. Prostate MRI in March 2016 revealed no findings suggestive of malignancy.  Patient has  declined any further biopsies.  ASSESSMENT: Prostate cancer/high-grade PIN  PLAN:    1. Prostate cancer/high-grade PIN: Previously, patient's PSA was elevated, but stable ranging between 15 and 27.06 since August 2019.  More recently his PSA increased to 72.37.  PET scan results from December 06, 2021 reviewed independently and reported as above with significant hypermetabolism in patient's prostate, but no other evidence of disease.  We discussed the possibility of XRT or seed implantation for treatment which patient has declined and he wishes to pursue monthly Firmagon and then transition to Lupron every 6 months.  Patient is also declined surgical intervention.  Proceed with treatment today.  Return to clinic in 4 weeks for further evaluation and continuation of Firmagon.  2.  Frequency: Follow-up with urology as above.  I spent a  total of 30 minutes reviewing chart data, face-to-face evaluation with the patient, counseling and coordination of care as detailed above.    Patient expressed understanding and was in agreement with this plan. He also understands that He can call clinic at any time with any questions, concerns, or complaints.    [name], MD   12/08/2021 12:12 PM

## 2022-01-04 ENCOUNTER — Encounter: Payer: Self-pay | Admitting: Oncology

## 2022-01-04 ENCOUNTER — Inpatient Hospital Stay: Payer: Medicare Other | Attending: Nurse Practitioner

## 2022-01-04 ENCOUNTER — Inpatient Hospital Stay: Payer: Medicare Other

## 2022-01-04 ENCOUNTER — Inpatient Hospital Stay (HOSPITAL_BASED_OUTPATIENT_CLINIC_OR_DEPARTMENT_OTHER): Payer: Medicare Other | Admitting: Oncology

## 2022-01-04 VITALS — BP 143/73 | HR 59 | Temp 98.2°F | Resp 16 | Ht 70.0 in | Wt 232.0 lb

## 2022-01-04 DIAGNOSIS — C61 Malignant neoplasm of prostate: Secondary | ICD-10-CM | POA: Diagnosis present

## 2022-01-04 DIAGNOSIS — Z5111 Encounter for antineoplastic chemotherapy: Secondary | ICD-10-CM | POA: Insufficient documentation

## 2022-01-04 DIAGNOSIS — Z85828 Personal history of other malignant neoplasm of skin: Secondary | ICD-10-CM | POA: Diagnosis not present

## 2022-01-04 DIAGNOSIS — Z7289 Other problems related to lifestyle: Secondary | ICD-10-CM | POA: Insufficient documentation

## 2022-01-04 DIAGNOSIS — Z801 Family history of malignant neoplasm of trachea, bronchus and lung: Secondary | ICD-10-CM | POA: Insufficient documentation

## 2022-01-04 DIAGNOSIS — R350 Frequency of micturition: Secondary | ICD-10-CM | POA: Insufficient documentation

## 2022-01-04 DIAGNOSIS — Z79899 Other long term (current) drug therapy: Secondary | ICD-10-CM | POA: Insufficient documentation

## 2022-01-04 DIAGNOSIS — Z87891 Personal history of nicotine dependence: Secondary | ICD-10-CM | POA: Insufficient documentation

## 2022-01-04 DIAGNOSIS — Z8249 Family history of ischemic heart disease and other diseases of the circulatory system: Secondary | ICD-10-CM | POA: Diagnosis not present

## 2022-01-04 DIAGNOSIS — Z833 Family history of diabetes mellitus: Secondary | ICD-10-CM | POA: Insufficient documentation

## 2022-01-04 LAB — COMPREHENSIVE METABOLIC PANEL
ALT: 24 U/L (ref 0–44)
AST: 26 U/L (ref 15–41)
Albumin: 4.1 g/dL (ref 3.5–5.0)
Alkaline Phosphatase: 48 U/L (ref 38–126)
Anion gap: 4 — ABNORMAL LOW (ref 5–15)
BUN: 20 mg/dL (ref 8–23)
CO2: 27 mmol/L (ref 22–32)
Calcium: 9.3 mg/dL (ref 8.9–10.3)
Chloride: 104 mmol/L (ref 98–111)
Creatinine, Ser: 1.15 mg/dL (ref 0.61–1.24)
GFR, Estimated: >60 mL/min (ref >60)
Glucose, Bld: 116 mg/dL — ABNORMAL HIGH (ref 70–99)
Potassium: 5.1 mmol/L (ref 3.5–5.1)
Sodium: 135 mmol/L (ref 135–145)
Total Bilirubin: 0.6 mg/dL (ref 0.3–1.2)
Total Protein: 7.3 g/dL (ref 6.5–8.1)

## 2022-01-04 LAB — CBC WITH DIFFERENTIAL/PLATELET
Abs Immature Granulocytes: 0.04 10*3/uL (ref 0.00–0.07)
Basophils Absolute: 0.1 10*3/uL (ref 0.0–0.1)
Basophils Relative: 1 %
Eosinophils Absolute: 0.2 10*3/uL (ref 0.0–0.5)
Eosinophils Relative: 2 %
HCT: 44.3 % (ref 39.0–52.0)
Hemoglobin: 15 g/dL (ref 13.0–17.0)
Immature Granulocytes: 1 %
Lymphocytes Relative: 35 %
Lymphs Abs: 2.4 10*3/uL (ref 0.7–4.0)
MCH: 30.2 pg (ref 26.0–34.0)
MCHC: 33.9 g/dL (ref 30.0–36.0)
MCV: 89.1 fL (ref 80.0–100.0)
Monocytes Absolute: 0.5 10*3/uL (ref 0.1–1.0)
Monocytes Relative: 7 %
Neutro Abs: 3.6 10*3/uL (ref 1.7–7.7)
Neutrophils Relative %: 54 %
Platelets: 213 10*3/uL (ref 150–400)
RBC: 4.97 MIL/uL (ref 4.22–5.81)
RDW: 12.5 % (ref 11.5–15.5)
WBC: 6.7 10*3/uL (ref 4.0–10.5)
nRBC: 0 % (ref 0.0–0.2)

## 2022-01-04 LAB — PSA: Prostatic Specific Antigen: 12.86 ng/mL — ABNORMAL HIGH (ref 0.00–4.00)

## 2022-01-04 MED ORDER — DEGARELIX ACETATE 80 MG ~~LOC~~ SOLR
80.0000 mg | Freq: Once | SUBCUTANEOUS | Status: AC
  Administered 2022-01-04: 80 mg via SUBCUTANEOUS
  Filled 2022-01-04: qty 4

## 2022-01-04 NOTE — Progress Notes (Signed)
[place]  Telephone:(336) [phone] Fax:(336) [phone]  ID: Daniel Ryan OB: [birth year]  MR#: [id number]  NOB#:[id number]  Patient Care Team: Dion Body, MD as PCP - General (Family Medicine)   CHIEF COMPLAINT: Prostate cancer/high-grade PIN  INTERVAL HISTORY: Patient returns to clinic today for further evaluation and continuation of Firmagon.  He continues to feel well and remains asymptomatic.  He remains active and is tolerating his treatment without significant side effects.  He denies any pain.  He has no neurologic complaints.  He has a good appetite and denies weight loss.  He denies any chest pain, shortness of breath, cough, or hemoptysis.  He denies any nausea, vomiting, constipation, or diarrhea.  He has chronic urinary frequency, but no other complaints.  Patient offers no specific complaints today.  REVIEW OF SYSTEMS:   Review of Systems  Constitutional: Negative.  Negative for fever, malaise/fatigue and weight loss.  Respiratory: Negative.  Negative for cough and shortness of breath.   Cardiovascular: Negative.  Negative for chest pain and leg swelling.  Gastrointestinal: Negative.  Negative for abdominal pain.  Genitourinary:  Positive for frequency. Negative for dysuria and hematuria.  Musculoskeletal: Negative.  Negative for back pain.  Skin: Negative.  Negative for rash.  Neurological: Negative.  Negative for sensory change, focal weakness, weakness and headaches.  Psychiatric/Behavioral: Negative.  The patient is not nervous/anxious.     As per HPI. Otherwise, a complete review of systems is negative.  PAST MEDICAL HISTORY: Past Medical History:  Diagnosis Date   Borderline diabetes    Prostate cancer ([place]) 07/2017    PAST SURGICAL HISTORY: Past Surgical History:  Procedure Laterality Date   COLONOSCOPY     COLONOSCOPY WITH PROPOFOL N/A 09/13/2016   Procedure: COLONOSCOPY WITH PROPOFOL;  Surgeon: Lollie Sails, MD;   Location: [facility] ENDOSCOPY;  Service: Endoscopy;  Laterality: N/A;   COLONOSCOPY WITH PROPOFOL N/A 11/23/2018   Procedure: COLONOSCOPY WITH PROPOFOL;  Surgeon: Robert Bellow, MD;  Location: [facility] ENDOSCOPY;  Service: Endoscopy;  Laterality: N/A;   FLEXIBLE SIGMOIDOSCOPY     prostate biopsies     SKIN CANCER EXCISION     dr dasher    FAMILY HISTORY: Family History  Problem Relation Age of Onset   Hypertension Mother    Heart attack Father    Lung cancer Father    Diabetes Brother    Bladder Cancer Neg Hx    Kidney cancer Neg Hx    Prostate cancer Neg Hx     ADVANCED DIRECTIVES (Y/N):  N  HEALTH MAINTENANCE: Social History   Tobacco Use   Smoking status: Former    Packs/day: 2.00    Years: 20.00    Total pack years: 40.00    Types: Cigarettes    Quit date: 10/12/1981    Years since quitting: 40.2   Smokeless tobacco: Never  Vaping Use   Vaping Use: Never used  Substance Use Topics   Alcohol use: Yes    Alcohol/week: 1.0 - 6.0 standard drink of alcohol    Types: 1 - 6 Cans of beer per week    Comment: 1-6 per day -varies   Drug use: No     Colonoscopy:  PAP:  Bone density:  Lipid panel:  No Known Allergies  Current Outpatient Medications  Medication Sig Dispense Refill   acetaminophen (TYLENOL) 500 MG tablet Take 500 mg by mouth every 6 (six) hours as needed.     ibuprofen (ADVIL) 200 MG tablet Take by mouth.  losartan (COZAAR) 25 MG tablet Take 1 tablet by mouth daily.     metFORMIN (GLUCOPHAGE-XR) 500 MG 24 hr tablet SMARTSIG:1 Tablet(s) By Mouth Every Evening     pravastatin (PRAVACHOL) 20 MG tablet Take by mouth.     sildenafil (REVATIO) 20 MG tablet 2-5 tabs 1 hour prior to intercourse 30 tablet 0   traZODone (DESYREL) 50 MG tablet Take 50 mg by mouth at bedtime.     loratadine (CLARITIN) 10 MG tablet Take 10 mg by mouth daily. (Patient not taking: Reported on 01/04/2022)     No current facility-administered medications for this visit.     OBJECTIVE: Vitals:   01/04/22 1107  BP: (!) 143/73  Pulse: (!) 59  Resp: 16  Temp: 98.2 F (36.8 C)  SpO2: 100%     Body mass index is 33.29 kg/m.    ECOG FS:0 - Asymptomatic   General: Well-developed, well-nourished, no acute distress. Eyes: Pink conjunctiva, anicteric sclera. HEENT: Normocephalic, moist mucous membranes. Lungs: No audible wheezing or coughing. Heart: Regular rate and rhythm. Abdomen: Soft, nontender, no obvious distention. Musculoskeletal: No edema, cyanosis, or clubbing. Neuro: Alert, answering all questions appropriately. Cranial nerves grossly intact. Skin: No rashes or petechiae noted. Psych: Normal affect.,  LAB RESULTS:  Lab Results  Component Value Date   NA 135 01/04/2022   K 5.1 01/04/2022   CL 104 01/04/2022   CO2 27 01/04/2022   GLUCOSE 116 (H) 01/04/2022   BUN 20 01/04/2022   CREATININE 1.15 01/04/2022   CALCIUM 9.3 01/04/2022   PROT 7.3 01/04/2022   ALBUMIN 4.1 01/04/2022   AST 26 01/04/2022   ALT 24 01/04/2022   ALKPHOS 48 01/04/2022   BILITOT 0.6 01/04/2022   GFRNONAA >60 01/04/2022    Lab Results  Component Value Date   WBC 6.7 01/04/2022   NEUTROABS 3.6 01/04/2022   HGB 15.0 01/04/2022   HCT 44.3 01/04/2022   MCV 89.1 01/04/2022   PLT 213 01/04/2022     STUDIES: No results found.  ONCOLOGY HISTORY: Patient has had multiple prostate biopsies in October 2009, December 2010, and February 2012.  All biopsies returned benign pathology.  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 Pathology was sent to [facility] for second opinion who concurred with the diagnosis. Prostate MRI in March 2016 revealed no findings suggestive of malignancy.  Patient has declined any further biopsies.  ASSESSMENT: Prostate cancer/high-grade PIN  PLAN:    1. Prostate cancer/high-grade PIN: Previously, patient's PSA was elevated, but stable ranging between 15 and 27.06 since August 2019.  More recently his PSA increased to  72.37.  PET scan results from December 06, 2021 reviewed independently and reported as above with significant hypermetabolism in patient's prostate, but no other evidence of disease.  We discussed the possibility of XRT or seed implantation for treatment which patient has declined and he wishes to pursue monthly Firmagon and then transition to Lupron every 6 months.  Patient is also declined surgical intervention.  Patient's most recent PSA is 59.17.  Today's result is pending.  Proceed with Mills Koller today.  Return to clinic in 4 weeks for further evaluation and continuation of treatment.  2.  Frequency: Follow-up with urology as above.  I spent a total of 30 minutes reviewing chart data, face-to-face evaluation with the patient, counseling and coordination of care as detailed above.   Patient expressed understanding and was in agreement with this plan. He also understands that He can call clinic at any time with  any questions, concerns, or complaints.    [name], MD   01/04/2022 2:28 PM

## 2022-02-01 ENCOUNTER — Inpatient Hospital Stay: Payer: Medicare Other

## 2022-02-01 ENCOUNTER — Inpatient Hospital Stay: Payer: Medicare Other | Admitting: Oncology

## 2022-02-04 ENCOUNTER — Other Ambulatory Visit: Payer: Self-pay

## 2022-02-04 DIAGNOSIS — C61 Malignant neoplasm of prostate: Secondary | ICD-10-CM

## 2022-02-09 ENCOUNTER — Inpatient Hospital Stay: Payer: Medicare Other | Attending: Nurse Practitioner

## 2022-02-09 ENCOUNTER — Inpatient Hospital Stay (HOSPITAL_BASED_OUTPATIENT_CLINIC_OR_DEPARTMENT_OTHER): Payer: Medicare Other | Admitting: Oncology

## 2022-02-09 ENCOUNTER — Encounter: Payer: Self-pay | Admitting: Oncology

## 2022-02-09 ENCOUNTER — Inpatient Hospital Stay: Payer: Medicare Other

## 2022-02-09 VITALS — BP 131/67 | HR 69 | Temp 98.5°F | Resp 16 | Ht 70.0 in | Wt 228.0 lb

## 2022-02-09 DIAGNOSIS — Z833 Family history of diabetes mellitus: Secondary | ICD-10-CM | POA: Diagnosis not present

## 2022-02-09 DIAGNOSIS — Z8249 Family history of ischemic heart disease and other diseases of the circulatory system: Secondary | ICD-10-CM | POA: Insufficient documentation

## 2022-02-09 DIAGNOSIS — Z7289 Other problems related to lifestyle: Secondary | ICD-10-CM | POA: Insufficient documentation

## 2022-02-09 DIAGNOSIS — Z87891 Personal history of nicotine dependence: Secondary | ICD-10-CM | POA: Diagnosis not present

## 2022-02-09 DIAGNOSIS — Z79899 Other long term (current) drug therapy: Secondary | ICD-10-CM | POA: Diagnosis not present

## 2022-02-09 DIAGNOSIS — C61 Malignant neoplasm of prostate: Secondary | ICD-10-CM

## 2022-02-09 DIAGNOSIS — Z801 Family history of malignant neoplasm of trachea, bronchus and lung: Secondary | ICD-10-CM | POA: Diagnosis not present

## 2022-02-09 DIAGNOSIS — R350 Frequency of micturition: Secondary | ICD-10-CM | POA: Insufficient documentation

## 2022-02-09 DIAGNOSIS — Z85828 Personal history of other malignant neoplasm of skin: Secondary | ICD-10-CM | POA: Diagnosis not present

## 2022-02-09 DIAGNOSIS — R5383 Other fatigue: Secondary | ICD-10-CM | POA: Diagnosis not present

## 2022-02-09 DIAGNOSIS — Z5111 Encounter for antineoplastic chemotherapy: Secondary | ICD-10-CM | POA: Diagnosis not present

## 2022-02-09 LAB — CBC WITH DIFFERENTIAL/PLATELET
Abs Immature Granulocytes: 0.04 10*3/uL (ref 0.00–0.07)
Basophils Absolute: 0 10*3/uL (ref 0.0–0.1)
Basophils Relative: 1 %
Eosinophils Absolute: 0.1 10*3/uL (ref 0.0–0.5)
Eosinophils Relative: 2 %
HCT: 44.1 % (ref 39.0–52.0)
Hemoglobin: 14.9 g/dL (ref 13.0–17.0)
Immature Granulocytes: 1 %
Lymphocytes Relative: 32 %
Lymphs Abs: 1.8 10*3/uL (ref 0.7–4.0)
MCH: 30 pg (ref 26.0–34.0)
MCHC: 33.8 g/dL (ref 30.0–36.0)
MCV: 88.9 fL (ref 80.0–100.0)
Monocytes Absolute: 0.7 10*3/uL (ref 0.1–1.0)
Monocytes Relative: 13 %
Neutro Abs: 3 10*3/uL (ref 1.7–7.7)
Neutrophils Relative %: 51 %
Platelets: 194 10*3/uL (ref 150–400)
RBC: 4.96 MIL/uL (ref 4.22–5.81)
RDW: 12.9 % (ref 11.5–15.5)
WBC: 5.6 10*3/uL (ref 4.0–10.5)
nRBC: 0 % (ref 0.0–0.2)

## 2022-02-09 LAB — COMPREHENSIVE METABOLIC PANEL
ALT: 34 U/L (ref 0–44)
AST: 27 U/L (ref 15–41)
Albumin: 4.2 g/dL (ref 3.5–5.0)
Alkaline Phosphatase: 57 U/L (ref 38–126)
Anion gap: 5 (ref 5–15)
BUN: 17 mg/dL (ref 8–23)
CO2: 26 mmol/L (ref 22–32)
Calcium: 8.7 mg/dL — ABNORMAL LOW (ref 8.9–10.3)
Chloride: 105 mmol/L (ref 98–111)
Creatinine, Ser: 1.09 mg/dL (ref 0.61–1.24)
GFR, Estimated: >60 mL/min (ref >60)
Glucose, Bld: 135 mg/dL — ABNORMAL HIGH (ref 70–99)
Potassium: 4.3 mmol/L (ref 3.5–5.1)
Sodium: 136 mmol/L (ref 135–145)
Total Bilirubin: 0.5 mg/dL (ref 0.3–1.2)
Total Protein: 7.4 g/dL (ref 6.5–8.1)

## 2022-02-09 MED ORDER — DEGARELIX ACETATE 80 MG ~~LOC~~ SOLR
80.0000 mg | Freq: Once | SUBCUTANEOUS | Status: AC
  Administered 2022-02-09: 80 mg via SUBCUTANEOUS
  Filled 2022-02-09: qty 4

## 2022-02-09 NOTE — Progress Notes (Signed)
[place]  Telephone:(336) [phone] Fax:(336) [phone]  ID: Janyth Pupa OB: 1945/10/18  MR#: [id number]  VFI#:[id number]  Patient Care Team: Dion Body, MD as PCP - General (Family Medicine)   CHIEF COMPLAINT: Prostate cancer/high-grade PIN  INTERVAL HISTORY: Patient returns to clinic today for routine evaluation and continuation of Firmagon.  He has noticed increased fatigue, but otherwise feels well.  Despite this, he continues to remain active.  He denies any pain.  He has no neurologic complaints.  He has a good appetite and denies weight loss.  He denies any chest pain, shortness of breath, cough, or hemoptysis.  He denies any nausea, vomiting, constipation, or diarrhea.  He has chronic urinary frequency, but no other complaints.  Patient offers no further specific complaints today.  REVIEW OF SYSTEMS:   Review of Systems  Constitutional: Negative.  Negative for fever, malaise/fatigue and weight loss.  Respiratory: Negative.  Negative for cough and shortness of breath.   Cardiovascular: Negative.  Negative for chest pain and leg swelling.  Gastrointestinal: Negative.  Negative for abdominal pain.  Genitourinary:  Positive for frequency. Negative for dysuria and hematuria.  Musculoskeletal: Negative.  Negative for back pain.  Skin: Negative.  Negative for rash.  Neurological: Negative.  Negative for sensory change, focal weakness, weakness and headaches.  Psychiatric/Behavioral: Negative.  The patient is not nervous/anxious.     As per HPI. Otherwise, a complete review of systems is negative.  PAST MEDICAL HISTORY: Past Medical History:  Diagnosis Date   Borderline diabetes    Prostate cancer ([place]) 07/2017    PAST SURGICAL HISTORY: Past Surgical History:  Procedure Laterality Date   COLONOSCOPY     COLONOSCOPY WITH PROPOFOL N/A 09/13/2016   Procedure: COLONOSCOPY WITH PROPOFOL;  Surgeon: Lollie Sails, MD;  Location: [facility]  ENDOSCOPY;  Service: Endoscopy;  Laterality: N/A;   COLONOSCOPY WITH PROPOFOL N/A 11/23/2018   Procedure: COLONOSCOPY WITH PROPOFOL;  Surgeon: Robert Bellow, MD;  Location: [facility] ENDOSCOPY;  Service: Endoscopy;  Laterality: N/A;   FLEXIBLE SIGMOIDOSCOPY     prostate biopsies     SKIN CANCER EXCISION     dr dasher    FAMILY HISTORY: Family History  Problem Relation Age of Onset   Hypertension Mother    Heart attack Father    Lung cancer Father    Diabetes Brother    Bladder Cancer Neg Hx    Kidney cancer Neg Hx    Prostate cancer Neg Hx     ADVANCED DIRECTIVES (Y/N):  N  HEALTH MAINTENANCE: Social History   Tobacco Use   Smoking status: Former    Packs/day: 2.00    Years: 20.00    Total pack years: 40.00    Types: Cigarettes    Quit date: 10/12/1981    Years since quitting: 40.3   Smokeless tobacco: Never  Vaping Use   Vaping Use: Never used  Substance Use Topics   Alcohol use: Yes    Alcohol/week: 1.0 - 6.0 standard drink of alcohol    Types: 1 - 6 Cans of beer per week    Comment: 1-6 per day -varies   Drug use: No     Colonoscopy:  PAP:  Bone density:  Lipid panel:  No Known Allergies  Current Outpatient Medications  Medication Sig Dispense Refill   acetaminophen (TYLENOL) 500 MG tablet Take 500 mg by mouth every 6 (six) hours as needed.     ibuprofen (ADVIL) 200 MG tablet Take by mouth.  losartan (COZAAR) 25 MG tablet Take 1 tablet by mouth daily.     metFORMIN (GLUCOPHAGE-XR) 500 MG 24 hr tablet SMARTSIG:1 Tablet(s) By Mouth Every Evening     pravastatin (PRAVACHOL) 20 MG tablet Take by mouth.     traZODone (DESYREL) 50 MG tablet Take 50 mg by mouth at bedtime.     loratadine (CLARITIN) 10 MG tablet Take 10 mg by mouth daily. (Patient not taking: Reported on 02/09/2022)     sildenafil (REVATIO) 20 MG tablet 2-5 tabs 1 hour prior to intercourse (Patient not taking: Reported on 02/09/2022) 30 tablet 0   No current facility-administered medications  for this visit.    OBJECTIVE: Vitals:   02/09/22 1020  BP: 131/67  Pulse: 69  Resp: 16  Temp: 98.5 F (36.9 C)  SpO2: 100%     Body mass index is 32.71 kg/m.    ECOG FS:0 - Asymptomatic   General: Well-developed, well-nourished, no acute distress. Eyes: Pink conjunctiva, anicteric sclera. HEENT: Normocephalic, moist mucous membranes. Lungs: No audible wheezing or coughing. Heart: Regular rate and rhythm. Abdomen: Soft, nontender, no obvious distention. Musculoskeletal: No edema, cyanosis, or clubbing. Neuro: Alert, answering all questions appropriately. Cranial nerves grossly intact. Skin: No rashes or petechiae noted. Psych: Normal affect.  LAB RESULTS:  Lab Results  Component Value Date   NA 136 02/09/2022   K 4.3 02/09/2022   CL 105 02/09/2022   CO2 26 02/09/2022   GLUCOSE 135 (H) 02/09/2022   BUN 17 02/09/2022   CREATININE 1.09 02/09/2022   CALCIUM 8.7 (L) 02/09/2022   PROT 7.4 02/09/2022   ALBUMIN 4.2 02/09/2022   AST 27 02/09/2022   ALT 34 02/09/2022   ALKPHOS 57 02/09/2022   BILITOT 0.5 02/09/2022   GFRNONAA >60 02/09/2022    Lab Results  Component Value Date   WBC 5.6 02/09/2022   NEUTROABS 3.0 02/09/2022   HGB 14.9 02/09/2022   HCT 44.1 02/09/2022   MCV 88.9 02/09/2022   PLT 194 02/09/2022     STUDIES: No results found.  ONCOLOGY HISTORY: Patient has had multiple prostate biopsies in October 2009, December 2010, and February 2012.  All biopsies returned benign pathology.  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 Pathology was sent to [facility] for second opinion who concurred with the diagnosis. Prostate MRI in March 2016 revealed no findings suggestive of malignancy.  Patient has declined any further biopsies.  ASSESSMENT: Prostate cancer/high-grade PIN  PLAN:    1. Prostate cancer/high-grade PIN: Previously, patient's PSA was elevated, but stable ranging between 15 and 27.06 since August 2019.  More recently his PSA  increased to 72.37.  PET scan results from December 06, 2021 reviewed independently with significant hypermetabolism in patient's prostate, but no other evidence of disease.  We discussed the possibility of XRT or seed implantation for treatment which patient has declined and he wishes to pursue monthly Firmagon and then transition to Lupron every 6 months.  Patient is also declined surgical intervention.  His PSA has now trended down to 12.86, today's result is pending.  Proceed with Mills Koller today.  Return to clinic in 4 weeks for further evaluation and continuation of treatment.   2.  Frequency: Continue follow-up with urology as scheduled. 3.  Fatigue: Likely secondary to [place], monitor.  Patient expressed understanding and was in agreement with this plan. He also understands that He can call clinic at any time with any questions, concerns, or complaints.    [name], MD  02/09/2022 12:28 PM

## 2022-02-10 LAB — PSA: Prostatic Specific Antigen: 4.22 ng/mL — ABNORMAL HIGH (ref 0.00–4.00)

## 2022-03-09 ENCOUNTER — Inpatient Hospital Stay: Payer: Medicare Other | Attending: Nurse Practitioner

## 2022-03-09 ENCOUNTER — Encounter: Payer: Self-pay | Admitting: Oncology

## 2022-03-09 ENCOUNTER — Inpatient Hospital Stay (HOSPITAL_BASED_OUTPATIENT_CLINIC_OR_DEPARTMENT_OTHER): Payer: Medicare Other | Admitting: Oncology

## 2022-03-09 ENCOUNTER — Inpatient Hospital Stay: Payer: Medicare Other

## 2022-03-09 VITALS — BP 150/76 | HR 63 | Temp 97.5°F | Resp 16 | Ht 70.0 in | Wt 226.0 lb

## 2022-03-09 DIAGNOSIS — Z79899 Other long term (current) drug therapy: Secondary | ICD-10-CM | POA: Insufficient documentation

## 2022-03-09 DIAGNOSIS — R5383 Other fatigue: Secondary | ICD-10-CM | POA: Diagnosis not present

## 2022-03-09 DIAGNOSIS — Z87891 Personal history of nicotine dependence: Secondary | ICD-10-CM | POA: Insufficient documentation

## 2022-03-09 DIAGNOSIS — Z85828 Personal history of other malignant neoplasm of skin: Secondary | ICD-10-CM | POA: Diagnosis not present

## 2022-03-09 DIAGNOSIS — C61 Malignant neoplasm of prostate: Secondary | ICD-10-CM

## 2022-03-09 DIAGNOSIS — Z801 Family history of malignant neoplasm of trachea, bronchus and lung: Secondary | ICD-10-CM | POA: Insufficient documentation

## 2022-03-09 DIAGNOSIS — Z5111 Encounter for antineoplastic chemotherapy: Secondary | ICD-10-CM | POA: Insufficient documentation

## 2022-03-09 DIAGNOSIS — Z7289 Other problems related to lifestyle: Secondary | ICD-10-CM | POA: Diagnosis not present

## 2022-03-09 DIAGNOSIS — Z8249 Family history of ischemic heart disease and other diseases of the circulatory system: Secondary | ICD-10-CM | POA: Insufficient documentation

## 2022-03-09 DIAGNOSIS — R350 Frequency of micturition: Secondary | ICD-10-CM | POA: Diagnosis not present

## 2022-03-09 DIAGNOSIS — Z833 Family history of diabetes mellitus: Secondary | ICD-10-CM | POA: Diagnosis not present

## 2022-03-09 LAB — COMPREHENSIVE METABOLIC PANEL
ALT: 23 U/L (ref 0–44)
AST: 22 U/L (ref 15–41)
Albumin: 4.1 g/dL (ref 3.5–5.0)
Alkaline Phosphatase: 52 U/L (ref 38–126)
Anion gap: 11 (ref 5–15)
BUN: 16 mg/dL (ref 8–23)
CO2: 27 mmol/L (ref 22–32)
Calcium: 9.7 mg/dL (ref 8.9–10.3)
Chloride: 97 mmol/L — ABNORMAL LOW (ref 98–111)
Creatinine, Ser: 1.15 mg/dL (ref 0.61–1.24)
GFR, Estimated: >60 mL/min (ref >60)
Glucose, Bld: 144 mg/dL — ABNORMAL HIGH (ref 70–99)
Potassium: 4.7 mmol/L (ref 3.5–5.1)
Sodium: 135 mmol/L (ref 135–145)
Total Bilirubin: 0.8 mg/dL (ref 0.3–1.2)
Total Protein: 7.5 g/dL (ref 6.5–8.1)

## 2022-03-09 LAB — CBC WITH DIFFERENTIAL/PLATELET
Abs Immature Granulocytes: 0.03 10*3/uL (ref 0.00–0.07)
Basophils Absolute: 0.1 10*3/uL (ref 0.0–0.1)
Basophils Relative: 1 %
Eosinophils Absolute: 0.2 10*3/uL (ref 0.0–0.5)
Eosinophils Relative: 2 %
HCT: 42 % (ref 39.0–52.0)
Hemoglobin: 14.8 g/dL (ref 13.0–17.0)
Immature Granulocytes: 0 %
Lymphocytes Relative: 35 %
Lymphs Abs: 2.7 10*3/uL (ref 0.7–4.0)
MCH: 30.5 pg (ref 26.0–34.0)
MCHC: 35.2 g/dL (ref 30.0–36.0)
MCV: 86.6 fL (ref 80.0–100.0)
Monocytes Absolute: 0.4 10*3/uL (ref 0.1–1.0)
Monocytes Relative: 6 %
Neutro Abs: 4.3 10*3/uL (ref 1.7–7.7)
Neutrophils Relative %: 56 %
Platelets: 214 10*3/uL (ref 150–400)
RBC: 4.85 MIL/uL (ref 4.22–5.81)
RDW: 12.5 % (ref 11.5–15.5)
WBC: 7.7 10*3/uL (ref 4.0–10.5)
nRBC: 0 % (ref 0.0–0.2)

## 2022-03-09 LAB — PSA: Prostatic Specific Antigen: 2.89 ng/mL (ref 0.00–4.00)

## 2022-03-09 MED ORDER — DEGARELIX ACETATE 80 MG ~~LOC~~ SOLR
80.0000 mg | Freq: Once | SUBCUTANEOUS | Status: AC
  Administered 2022-03-09: 80 mg via SUBCUTANEOUS
  Filled 2022-03-09: qty 4

## 2022-03-09 NOTE — Progress Notes (Signed)
[place]  Telephone:(336) [phone] Fax:(336) [phone]  ID: Daniel Ryan OB: Dec 17, 1945  MR#: [id number]  LAG#:[id number]  Patient Care Team: Dion Body, MD as PCP - General (Family Medicine) [name], MD as Consulting Physician (Oncology)   CHIEF COMPLAINT: Prostate cancer/high-grade PIN  INTERVAL HISTORY: Patient returns to clinic today for repeat laboratory work, further evaluation, and continuation of Firmagon.  He has mild fatigue, but this does not affect his day-to-day activity.  He continues to remain active and play golf regularly.  He currently feels well.  He denies any pain.  He has no neurologic complaints.  He has a good appetite and denies weight loss.  He denies any chest pain, shortness of breath, cough, or hemoptysis.  He denies any nausea, vomiting, constipation, or diarrhea.  He has chronic urinary frequency, but no other complaints.  Patient offers no further specific complaints today.  REVIEW OF SYSTEMS:   Review of Systems  Constitutional: Negative.  Negative for fever, malaise/fatigue and weight loss.  Respiratory: Negative.  Negative for cough and shortness of breath.   Cardiovascular: Negative.  Negative for chest pain and leg swelling.  Gastrointestinal: Negative.  Negative for abdominal pain.  Genitourinary:  Positive for frequency. Negative for dysuria and hematuria.  Musculoskeletal: Negative.  Negative for back pain.  Skin: Negative.  Negative for rash.  Neurological: Negative.  Negative for sensory change, focal weakness, weakness and headaches.  Psychiatric/Behavioral: Negative.  The patient is not nervous/anxious.     As per HPI. Otherwise, a complete review of systems is negative.  PAST MEDICAL HISTORY: Past Medical History:  Diagnosis Date   Borderline diabetes    Prostate cancer ([place]) 07/2017    PAST SURGICAL HISTORY: Past Surgical History:  Procedure Laterality Date   COLONOSCOPY      COLONOSCOPY WITH PROPOFOL N/A 09/13/2016   Procedure: COLONOSCOPY WITH PROPOFOL;  Surgeon: Lollie Sails, MD;  Location: [facility] ENDOSCOPY;  Service: Endoscopy;  Laterality: N/A;   COLONOSCOPY WITH PROPOFOL N/A 11/23/2018   Procedure: COLONOSCOPY WITH PROPOFOL;  Surgeon: Robert Bellow, MD;  Location: [facility] ENDOSCOPY;  Service: Endoscopy;  Laterality: N/A;   FLEXIBLE SIGMOIDOSCOPY     prostate biopsies     SKIN CANCER EXCISION     dr dasher    FAMILY HISTORY: Family History  Problem Relation Age of Onset   Hypertension Mother    Heart attack Father    Lung cancer Father    Diabetes Brother    Bladder Cancer Neg Hx    Kidney cancer Neg Hx    Prostate cancer Neg Hx     ADVANCED DIRECTIVES (Y/N):  N  HEALTH MAINTENANCE: Social History   Tobacco Use   Smoking status: Former    Packs/day: 2.00    Years: 20.00    Total pack years: 40.00    Types: Cigarettes    Quit date: 10/12/1981    Years since quitting: 40.4   Smokeless tobacco: Never  Vaping Use   Vaping Use: Never used  Substance Use Topics   Alcohol use: Yes    Alcohol/week: 1.0 - 6.0 standard drink of alcohol    Types: 1 - 6 Cans of beer per week    Comment: 1-6 per day -varies   Drug use: No     Colonoscopy:  PAP:  Bone density:  Lipid panel:  No Known Allergies  Current Outpatient Medications  Medication Sig Dispense Refill   acetaminophen (TYLENOL) 500 MG tablet Take 500 mg by mouth  every 6 (six) hours as needed.     ibuprofen (ADVIL) 200 MG tablet Take by mouth.     metFORMIN (GLUCOPHAGE-XR) 500 MG 24 hr tablet SMARTSIG:1 Tablet(s) By Mouth Every Evening     traZODone (DESYREL) 50 MG tablet Take 50 mg by mouth at bedtime.     loratadine (CLARITIN) 10 MG tablet Take 10 mg by mouth daily. (Patient not taking: Reported on 02/09/2022)     losartan (COZAAR) 25 MG tablet Take 1 tablet by mouth daily.     pravastatin (PRAVACHOL) 20 MG tablet Take by mouth.     sildenafil (REVATIO) 20 MG tablet 2-5 tabs 1  hour prior to intercourse (Patient not taking: Reported on 02/09/2022) 30 tablet 0   No current facility-administered medications for this visit.   Facility-Administered Medications Ordered in Other Visits  Medication Dose Route Frequency Provider Last Rate Last Admin   degarelix (FIRMAGON) injection 80 mg  80 mg Subcutaneous Once [name], MD        OBJECTIVE: Vitals:   03/09/22 1020  BP: (!) 150/76  Pulse: 63  Resp: 16  Temp: (!) 97.5 F (36.4 C)  SpO2: 100%     Body mass index is 32.43 kg/m.    ECOG FS:0 - Asymptomatic   General: Well-developed, well-nourished, no acute distress. Eyes: Pink conjunctiva, anicteric sclera. HEENT: Normocephalic, moist mucous membranes. Lungs: No audible wheezing or coughing. Heart: Regular rate and rhythm. Abdomen: Soft, nontender, no obvious distention. Musculoskeletal: No edema, cyanosis, or clubbing. Neuro: Alert, answering all questions appropriately. Cranial nerves grossly intact. Skin: No rashes or petechiae noted. Psych: Normal affect.  LAB RESULTS:  Lab Results  Component Value Date   NA 135 03/09/2022   K 4.7 03/09/2022   CL 97 (L) 03/09/2022   CO2 27 03/09/2022   GLUCOSE 144 (H) 03/09/2022   BUN 16 03/09/2022   CREATININE 1.15 03/09/2022   CALCIUM 9.7 03/09/2022   PROT 7.5 03/09/2022   ALBUMIN 4.1 03/09/2022   AST 22 03/09/2022   ALT 23 03/09/2022   ALKPHOS 52 03/09/2022   BILITOT 0.8 03/09/2022   GFRNONAA >60 03/09/2022    Lab Results  Component Value Date   WBC 7.7 03/09/2022   NEUTROABS 4.3 03/09/2022   HGB 14.8 03/09/2022   HCT 42.0 03/09/2022   MCV 86.6 03/09/2022   PLT 214 03/09/2022     STUDIES: No results found.  ONCOLOGY HISTORY: Patient has had multiple prostate biopsies in October 2009, December 2010, and February 2012.  All biopsies returned benign pathology.  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 Pathology was sent to [facility] for second opinion who concurred  with the diagnosis. Prostate MRI in March 2016 revealed no findings suggestive of malignancy.  Patient has declined any further biopsies.  ASSESSMENT: Prostate cancer/high-grade PIN  PLAN:    1. Prostate cancer/high-grade PIN: Previously, patient's PSA was elevated, but stable ranging between 15 and 27.06 since August 2019.  More recently his PSA increased to 72.37.  PET scan results from December 06, 2021 reviewed independently with significant hypermetabolism in patient's prostate, but no other evidence of disease.  We discussed the possibility of XRT or seed implantation for treatment which patient has declined and he wishes to pursue monthly Firmagon and then transition to Lupron every 6 months.  Patient is also declined surgical intervention.  His PSA has significantly improved but his most recent result was 1.22.  Today's result is pending.  Proceed with Mills Koller today.  Return  to clinic in 4 weeks for further evaluation and continuation of treatment.  Will transition to Lupron in approximately March 2024.    2.  Frequency: Chronic and unchanged.  Continue follow-up with urology as scheduled. 3.  Fatigue: Mild.  Likely secondary to [place], monitor.  I spent a total of 30 minutes reviewing chart data, face-to-face evaluation with the patient, counseling and coordination of care as detailed above.   Patient expressed understanding and was in agreement with this plan. He also understands that He can call clinic at any time with any questions, concerns, or complaints.    [name], MD   03/09/2022 10:59 AM

## 2022-04-05 ENCOUNTER — Other Ambulatory Visit: Payer: Self-pay

## 2022-04-05 DIAGNOSIS — C61 Malignant neoplasm of prostate: Secondary | ICD-10-CM

## 2022-04-06 ENCOUNTER — Inpatient Hospital Stay: Payer: Medicare Other | Attending: Nurse Practitioner

## 2022-04-06 ENCOUNTER — Inpatient Hospital Stay (HOSPITAL_BASED_OUTPATIENT_CLINIC_OR_DEPARTMENT_OTHER): Payer: Medicare Other | Admitting: Oncology

## 2022-04-06 ENCOUNTER — Encounter: Payer: Self-pay | Admitting: Oncology

## 2022-04-06 ENCOUNTER — Inpatient Hospital Stay: Payer: Medicare Other

## 2022-04-06 VITALS — BP 158/78 | HR 61 | Temp 96.9°F | Resp 16 | Ht 70.0 in | Wt 230.0 lb

## 2022-04-06 DIAGNOSIS — Z8249 Family history of ischemic heart disease and other diseases of the circulatory system: Secondary | ICD-10-CM | POA: Insufficient documentation

## 2022-04-06 DIAGNOSIS — C61 Malignant neoplasm of prostate: Secondary | ICD-10-CM

## 2022-04-06 DIAGNOSIS — I10 Essential (primary) hypertension: Secondary | ICD-10-CM | POA: Diagnosis not present

## 2022-04-06 DIAGNOSIS — Z85828 Personal history of other malignant neoplasm of skin: Secondary | ICD-10-CM | POA: Insufficient documentation

## 2022-04-06 DIAGNOSIS — R350 Frequency of micturition: Secondary | ICD-10-CM | POA: Insufficient documentation

## 2022-04-06 DIAGNOSIS — Z5111 Encounter for antineoplastic chemotherapy: Secondary | ICD-10-CM | POA: Insufficient documentation

## 2022-04-06 DIAGNOSIS — R5383 Other fatigue: Secondary | ICD-10-CM | POA: Diagnosis not present

## 2022-04-06 DIAGNOSIS — Z87891 Personal history of nicotine dependence: Secondary | ICD-10-CM | POA: Diagnosis not present

## 2022-04-06 DIAGNOSIS — Z833 Family history of diabetes mellitus: Secondary | ICD-10-CM | POA: Insufficient documentation

## 2022-04-06 DIAGNOSIS — Z79899 Other long term (current) drug therapy: Secondary | ICD-10-CM | POA: Insufficient documentation

## 2022-04-06 DIAGNOSIS — Z801 Family history of malignant neoplasm of trachea, bronchus and lung: Secondary | ICD-10-CM | POA: Diagnosis not present

## 2022-04-06 DIAGNOSIS — Z7289 Other problems related to lifestyle: Secondary | ICD-10-CM | POA: Diagnosis not present

## 2022-04-06 LAB — CBC WITH DIFFERENTIAL (CANCER CENTER ONLY)
Abs Immature Granulocytes: 0.03 10*3/uL (ref 0.00–0.07)
Basophils Absolute: 0 10*3/uL (ref 0.0–0.1)
Basophils Relative: 1 %
Eosinophils Absolute: 0.1 10*3/uL (ref 0.0–0.5)
Eosinophils Relative: 2 %
HCT: 40.8 % (ref 39.0–52.0)
Hemoglobin: 13.8 g/dL (ref 13.0–17.0)
Immature Granulocytes: 1 %
Lymphocytes Relative: 36 %
Lymphs Abs: 2.2 10*3/uL (ref 0.7–4.0)
MCH: 30.5 pg (ref 26.0–34.0)
MCHC: 33.8 g/dL (ref 30.0–36.0)
MCV: 90.3 fL (ref 80.0–100.0)
Monocytes Absolute: 0.4 10*3/uL (ref 0.1–1.0)
Monocytes Relative: 7 %
Neutro Abs: 3.4 10*3/uL (ref 1.7–7.7)
Neutrophils Relative %: 53 %
Platelet Count: 212 10*3/uL (ref 150–400)
RBC: 4.52 MIL/uL (ref 4.22–5.81)
RDW: 12.5 % (ref 11.5–15.5)
WBC Count: 6.2 10*3/uL (ref 4.0–10.5)
nRBC: 0 % (ref 0.0–0.2)

## 2022-04-06 LAB — CMP (CANCER CENTER ONLY)
ALT: 22 U/L (ref 0–44)
AST: 23 U/L (ref 15–41)
Albumin: 4 g/dL (ref 3.5–5.0)
Alkaline Phosphatase: 55 U/L (ref 38–126)
Anion gap: 8 (ref 5–15)
BUN: 15 mg/dL (ref 8–23)
CO2: 26 mmol/L (ref 22–32)
Calcium: 9.3 mg/dL (ref 8.9–10.3)
Chloride: 102 mmol/L (ref 98–111)
Creatinine: 1.14 mg/dL (ref 0.61–1.24)
GFR, Estimated: >60 mL/min (ref >60)
Glucose, Bld: 152 mg/dL — ABNORMAL HIGH (ref 70–99)
Potassium: 4.3 mmol/L (ref 3.5–5.1)
Sodium: 136 mmol/L (ref 135–145)
Total Bilirubin: 0.7 mg/dL (ref 0.3–1.2)
Total Protein: 7.2 g/dL (ref 6.5–8.1)

## 2022-04-06 MED ORDER — DEGARELIX ACETATE 80 MG ~~LOC~~ SOLR
80.0000 mg | Freq: Once | SUBCUTANEOUS | Status: AC
  Administered 2022-04-06: 80 mg via SUBCUTANEOUS
  Filled 2022-04-06: qty 4

## 2022-04-06 NOTE — Progress Notes (Signed)
[place]  Telephone:(336) [phone] Fax:(336) [phone]  ID: Daniel Ryan OB: [birth year]  MR#: [id number]  [id number]  Patient Care Team: Dion Body, MD as PCP - General (Family Medicine) [name], MD as Consulting Physician (Oncology)   CHIEF COMPLAINT: Prostate cancer/high-grade PIN  INTERVAL HISTORY: Patient returns to clinic today for repeat laboratory work, further evaluation, and continuation of Firmagon.  He currently feels well and is asymptomatic.  He does not complain of any weakness or fatigue today.  He continues to remain active and play golf regularly.  He denies any pain.  He has no neurologic complaints.  He has a good appetite and denies weight loss.  He denies any chest pain, shortness of breath, cough, or hemoptysis.  He denies any nausea, vomiting, constipation, or diarrhea.  He has chronic urinary frequency, but no other complaints.  Patient offers no specific complaints today.  REVIEW OF SYSTEMS:   Review of Systems  Constitutional: Negative.  Negative for fever, malaise/fatigue and weight loss.  Respiratory: Negative.  Negative for cough and shortness of breath.   Cardiovascular: Negative.  Negative for chest pain and leg swelling.  Gastrointestinal: Negative.  Negative for abdominal pain.  Genitourinary:  Positive for frequency. Negative for dysuria and hematuria.  Musculoskeletal: Negative.  Negative for back pain.  Skin: Negative.  Negative for rash.  Neurological: Negative.  Negative for sensory change, focal weakness, weakness and headaches.  Psychiatric/Behavioral: Negative.  The patient is not nervous/anxious.     As per HPI. Otherwise, a complete review of systems is negative.  PAST MEDICAL HISTORY: Past Medical History:  Diagnosis Date   Borderline diabetes    Prostate cancer ([place]) 07/2017    PAST SURGICAL HISTORY: Past Surgical History:  Procedure Laterality Date   COLONOSCOPY     COLONOSCOPY  WITH PROPOFOL N/A 09/13/2016   Procedure: COLONOSCOPY WITH PROPOFOL;  Surgeon: Lollie Sails, MD;  Location: [facility] ENDOSCOPY;  Service: Endoscopy;  Laterality: N/A;   COLONOSCOPY WITH PROPOFOL N/A 11/23/2018   Procedure: COLONOSCOPY WITH PROPOFOL;  Surgeon: Robert Bellow, MD;  Location: [facility] ENDOSCOPY;  Service: Endoscopy;  Laterality: N/A;   FLEXIBLE SIGMOIDOSCOPY     prostate biopsies     SKIN CANCER EXCISION     dr dasher    FAMILY HISTORY: Family History  Problem Relation Age of Onset   Hypertension Mother    Heart attack Father    Lung cancer Father    Diabetes Brother    Bladder Cancer Neg Hx    Kidney cancer Neg Hx    Prostate cancer Neg Hx     ADVANCED DIRECTIVES (Y/N):  N  HEALTH MAINTENANCE: Social History   Tobacco Use   Smoking status: Former    Packs/day: 2.00    Years: 20.00    Total pack years: 40.00    Types: Cigarettes    Quit date: 10/12/1981    Years since quitting: 40.5   Smokeless tobacco: Never  Vaping Use   Vaping Use: Never used  Substance Use Topics   Alcohol use: Yes    Alcohol/week: 1.0 - 6.0 standard drink of alcohol    Types: 1 - 6 Cans of beer per week    Comment: 1-6 per day -varies   Drug use: No     Colonoscopy:  PAP:  Bone density:  Lipid panel:  No Known Allergies  Current Outpatient Medications  Medication Sig Dispense Refill   acetaminophen (TYLENOL) 500 MG tablet Take 500 mg by mouth  every 6 (six) hours as needed.     ibuprofen (ADVIL) 200 MG tablet Take by mouth.     loratadine (CLARITIN) 10 MG tablet Take 10 mg by mouth daily.     losartan (COZAAR) 25 MG tablet Take 1 tablet by mouth daily.     metFORMIN (GLUCOPHAGE-XR) 500 MG 24 hr tablet SMARTSIG:1 Tablet(s) By Mouth Every Evening     pravastatin (PRAVACHOL) 20 MG tablet Take by mouth.     sildenafil (REVATIO) 20 MG tablet 2-5 tabs 1 hour prior to intercourse 30 tablet 0   traZODone (DESYREL) 50 MG tablet Take 50 mg by mouth at bedtime.     No current  facility-administered medications for this visit.    OBJECTIVE: Vitals:   04/06/22 1058  BP: (!) 158/78  Pulse: 61  Resp: 16  Temp: (!) 96.9 F (36.1 C)  SpO2: 99%     Body mass index is 33 kg/m.    ECOG FS:0 - Asymptomatic   General: Well-developed, well-nourished, no acute distress. Eyes: Pink conjunctiva, anicteric sclera. HEENT: Normocephalic, moist mucous membranes. Lungs: No audible wheezing or coughing. Heart: Regular rate and rhythm. Abdomen: Soft, nontender, no obvious distention. Musculoskeletal: No edema, cyanosis, or clubbing. Neuro: Alert, answering all questions appropriately. Cranial nerves grossly intact. Skin: No rashes or petechiae noted. Psych: Normal affect.  LAB RESULTS:  Lab Results  Component Value Date   NA 136 04/06/2022   K 4.3 04/06/2022   CL 102 04/06/2022   CO2 26 04/06/2022   GLUCOSE 152 (H) 04/06/2022   BUN 15 04/06/2022   CREATININE 1.14 04/06/2022   CALCIUM 9.3 04/06/2022   PROT 7.2 04/06/2022   ALBUMIN 4.0 04/06/2022   AST 23 04/06/2022   ALT 22 04/06/2022   ALKPHOS 55 04/06/2022   BILITOT 0.7 04/06/2022   GFRNONAA >60 04/06/2022    Lab Results  Component Value Date   WBC 6.2 04/06/2022   NEUTROABS 3.4 04/06/2022   HGB 13.8 04/06/2022   HCT 40.8 04/06/2022   MCV 90.3 04/06/2022   PLT 212 04/06/2022     STUDIES: No results found.  ONCOLOGY HISTORY: Patient has had multiple prostate biopsies in October 2009, December 2010, and February 2012.  All biopsies returned benign pathology.  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 Pathology was sent to [facility] for second opinion who concurred with the diagnosis. Prostate MRI in March 2016 revealed no findings suggestive of malignancy.  Patient has declined any further biopsies.  ASSESSMENT: Prostate cancer/high-grade PIN  PLAN:    Prostate cancer/high-grade PIN: Previously, patient's PSA was elevated, but stable ranging between 15 and 27.06 since August  2019.  More recently his PSA increased to 72.37.  PET scan results from December 06, 2021 reviewed independently with significant hypermetabolism in patient's prostate, but no other evidence of disease.  We discussed the possibility of XRT or seed implantation for treatment which patient has declined and he wishes to pursue monthly Firmagon and then transition to Eligard every 6 months.  Patient also declined surgical intervention.  His PSA has significantly improved but his most recent result 2.89.  Proceed with [place] today.  Return to clinic in 4 weeks with repeat laboratory work, further evaluation, and transition to 45 mg Eligard. Frequency: Chronic and unchanged.  Patient has been instructed to keep his follow-up appointment with urology in early March. Fatigue: Patient does not complain of this today.  Likely secondary to [place], monitor. Hypertension: Patient's blood pressure is moderately elevated today.  Continue monitoring and treatment per primary care.   Patient expressed understanding and was in agreement with this plan. He also understands that He can call clinic at any time with any questions, concerns, or complaints.    [name], MD   04/06/2022 12:15 PM

## 2022-04-06 NOTE — Progress Notes (Signed)
Mentioned feeling cold the days he gets his shot.

## 2022-04-20 ENCOUNTER — Other Ambulatory Visit: Payer: Self-pay

## 2022-04-20 NOTE — Progress Notes (Signed)
Pt presents today to complete Pre-employment UDS, CLEARED, HR NOTIFIED.

## 2022-04-26 ENCOUNTER — Other Ambulatory Visit: Payer: Self-pay

## 2022-04-26 ENCOUNTER — Other Ambulatory Visit: Payer: Medicare Other

## 2022-04-26 DIAGNOSIS — C61 Malignant neoplasm of prostate: Secondary | ICD-10-CM

## 2022-04-26 DIAGNOSIS — N400 Enlarged prostate without lower urinary tract symptoms: Secondary | ICD-10-CM

## 2022-04-27 LAB — PSA: Prostate Specific Ag, Serum: 1.3 ng/mL (ref 0.0–4.0)

## 2022-04-28 ENCOUNTER — Ambulatory Visit (INDEPENDENT_AMBULATORY_CARE_PROVIDER_SITE_OTHER): Payer: Medicare Other | Admitting: Urology

## 2022-04-28 ENCOUNTER — Encounter: Payer: Self-pay | Admitting: Urology

## 2022-04-28 VITALS — BP 136/78 | HR 72 | Ht 70.0 in | Wt 233.0 lb

## 2022-04-28 DIAGNOSIS — C61 Malignant neoplasm of prostate: Secondary | ICD-10-CM

## 2022-04-28 NOTE — Progress Notes (Signed)
04/28/2022 1:07 PM   Daniel Ryan 1945/08/08 [id number]  Referring provider: Dion Body, MD [facility] [place],  [place] [address]  Chief Complaint  Patient presents with   Follow-up    Urologic history: 1. 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  -Fusion biopsy 06/2017; PSA 28.4; ROI negative for cancer however right apical biopsy with Gleason 3+4 adenocarcinoma involving 20% submitted tissue -Declined treatment and elected surveillance -Prior negative biopsies 2009, 2010 and 2012 for PSA levels 9.5, 13 and 20 respectively all with benign pathology -Saturation biopsies December 2013 PSA 29.9; 1 core with focus high-grade PIN -Prostate MRI 02/2014 PSA bump 36.7 with no findings suggestive high-grade cancer -Repeat prostate MRI 4/29; PSA 28.4; 27 g prostate with PI-RADS 3 lesion transition zone -Bone scan 08/2017 multiple areas slightly increased activity in spine, ribs and left lower extremity felt unlikely related to prostate cancer   HPI: 77 y.o. male presents for follow-up of prostate cancer.  No complaints since last visit PSA increased to 72.37 September 2023 He agreed to PSMA/PET which showed a large area of marked radiotracer accumulation in the central prostate gland consistent with recurrent disease.  There were no visceral skeletal or lymph node metastasis He was started on Firmagon and will transition to leuprolide PSA has declined nicely and was 1.3 04/26/2022   PMH: Past Medical History:  Diagnosis Date   Borderline diabetes    Prostate cancer ([place]) 07/2017    Surgical History: Past Surgical History:  Procedure Laterality Date   COLONOSCOPY     COLONOSCOPY WITH PROPOFOL N/A 09/13/2016   Procedure: COLONOSCOPY WITH PROPOFOL;  Surgeon: Lollie Sails, MD;  Location[facility] END[facility]COPY;  Service: Endoscopy;  Laterality: N/A;   COLONOSCOPY WITH PROPOFOL N/A 11/23/2018   Procedure: COLONOSCOPY WITH PROPOFOL;  Surgeon:  Robert Bellow, MD;  Location: [facility] ENDOSCOPY;  Service: Endoscopy;  Laterality: N/A;   FLEXIBLE SIGMOIDOSCOPY     prostate biopsies     SKIN CANCER EXCISION     dr dasher    Home Medications:  Allergies as of 04/28/2022   No Known Allergies      Medication List        Accurate as of April 28, 2022  1:07 PM. If you have any questions, ask your nurse or doctor.          STOP taking these medications    acetaminophen 500 MG tablet Commonly known as: TYLENOL   loratadine 10 MG tablet Commonly known as: CLARITIN   sildenafil 20 MG tablet Commonly known as: REVATIO       TAKE these medications    ibuprofen 200 MG tablet Commonly known as: ADVIL Take by mouth.   losartan 25 MG tablet Commonly known as: COZAAR Take 1 tablet by mouth daily.   metFORMIN 500 MG 24 hr tablet Commonly known as: GLUCOPHAGE-XR SMARTSIG:1 Tablet(s) By Mouth Every Evening   OneTouch Ultra test strip Generic drug: glucose blood CHECK BLOOD SUGAR ONCE DAILY FASTING WITH GOAL LESS THAN 150. ONE TOUCH DX E11.69.   pravastatin 20 MG tablet Commonly known as: PRAVACHOL Take by mouth.   traZODone 50 MG tablet Commonly known as: DESYREL Take 50 mg by mouth at bedtime.        Allergies: No Known Allergies  Family History: Family History  Problem Relation Age of Onset   Hypertension Mother    Heart attack Father    Lung cancer Father    Diabetes Brother    Bladder Cancer  Neg Hx    Kidney cancer Neg Hx    Prostate cancer Neg Hx     Social History:  reports that he quit smoking about 40 years ago. His smoking use included cigarettes. He has a 40.00 pack-year smoking history. He has never used smokeless tobacco. He reports current alcohol use of about 1.0 - 6.0 standard drink of alcohol per week. He reports that he does not use drugs.   Physical Exam: BP 136/78   Pulse 72   Ht '5\' 10"'$  (1.778 m)   Wt 233 lb (105.7 kg)   BMI 33.43 kg/m   Constitutional:  Alert and  oriented, No acute distress. HEENT: [place] AT, moist mucus membranes.  Trachea midline, no masses. Cardiovascular: No clubbing, cyanosis, or edema. Respiratory: Normal respiratory effort, no increased work of breathing.    Assessment & Plan:    1.  Prostate cancer (intermediate risk, Gleason 3+4) Recurrent disease on ADT with significant improvement in his PSA He will continue his right arm medical oncology follow-ups Follow-up with me 1 year   Abbie Sons, MD  [facility] [address] [place], [place] [address] [phone]

## 2022-05-04 ENCOUNTER — Other Ambulatory Visit: Payer: Medicare Other

## 2022-05-04 ENCOUNTER — Ambulatory Visit: Payer: Medicare Other | Admitting: Oncology

## 2022-05-04 ENCOUNTER — Ambulatory Visit: Payer: Medicare Other

## 2022-05-05 ENCOUNTER — Encounter: Payer: Self-pay | Admitting: Oncology

## 2022-05-05 ENCOUNTER — Inpatient Hospital Stay: Payer: Medicare Other

## 2022-05-05 ENCOUNTER — Inpatient Hospital Stay (HOSPITAL_BASED_OUTPATIENT_CLINIC_OR_DEPARTMENT_OTHER): Payer: Medicare Other | Admitting: Oncology

## 2022-05-05 ENCOUNTER — Inpatient Hospital Stay: Payer: Medicare Other | Attending: Nurse Practitioner

## 2022-05-05 VITALS — BP 164/68 | HR 63 | Temp 96.6°F | Resp 16 | Ht 70.0 in | Wt 230.0 lb

## 2022-05-05 DIAGNOSIS — R5383 Other fatigue: Secondary | ICD-10-CM | POA: Insufficient documentation

## 2022-05-05 DIAGNOSIS — Z85828 Personal history of other malignant neoplasm of skin: Secondary | ICD-10-CM | POA: Diagnosis not present

## 2022-05-05 DIAGNOSIS — C61 Malignant neoplasm of prostate: Secondary | ICD-10-CM | POA: Diagnosis present

## 2022-05-05 DIAGNOSIS — Z79899 Other long term (current) drug therapy: Secondary | ICD-10-CM | POA: Diagnosis not present

## 2022-05-05 DIAGNOSIS — Z5111 Encounter for antineoplastic chemotherapy: Secondary | ICD-10-CM | POA: Insufficient documentation

## 2022-05-05 DIAGNOSIS — I10 Essential (primary) hypertension: Secondary | ICD-10-CM | POA: Diagnosis not present

## 2022-05-05 DIAGNOSIS — Z801 Family history of malignant neoplasm of trachea, bronchus and lung: Secondary | ICD-10-CM | POA: Insufficient documentation

## 2022-05-05 DIAGNOSIS — Z833 Family history of diabetes mellitus: Secondary | ICD-10-CM | POA: Insufficient documentation

## 2022-05-05 DIAGNOSIS — E871 Hypo-osmolality and hyponatremia: Secondary | ICD-10-CM | POA: Diagnosis not present

## 2022-05-05 DIAGNOSIS — Z8249 Family history of ischemic heart disease and other diseases of the circulatory system: Secondary | ICD-10-CM | POA: Diagnosis not present

## 2022-05-05 DIAGNOSIS — Z87891 Personal history of nicotine dependence: Secondary | ICD-10-CM | POA: Insufficient documentation

## 2022-05-05 DIAGNOSIS — R350 Frequency of micturition: Secondary | ICD-10-CM | POA: Diagnosis not present

## 2022-05-05 LAB — CBC WITH DIFFERENTIAL/PLATELET
Abs Immature Granulocytes: 0.03 10*3/uL (ref 0.00–0.07)
Basophils Absolute: 0 10*3/uL (ref 0.0–0.1)
Basophils Relative: 1 %
Eosinophils Absolute: 0.1 10*3/uL (ref 0.0–0.5)
Eosinophils Relative: 2 %
HCT: 47.6 % (ref 39.0–52.0)
Hemoglobin: 16.1 g/dL (ref 13.0–17.0)
Immature Granulocytes: 0 %
Lymphocytes Relative: 39 %
Lymphs Abs: 2.7 10*3/uL (ref 0.7–4.0)
MCH: 30.6 pg (ref 26.0–34.0)
MCHC: 33.8 g/dL (ref 30.0–36.0)
MCV: 90.3 fL (ref 80.0–100.0)
Monocytes Absolute: 0.4 10*3/uL (ref 0.1–1.0)
Monocytes Relative: 6 %
Neutro Abs: 3.6 10*3/uL (ref 1.7–7.7)
Neutrophils Relative %: 52 %
Platelets: 238 10*3/uL (ref 150–400)
RBC: 5.27 MIL/uL (ref 4.22–5.81)
RDW: 12.5 % (ref 11.5–15.5)
WBC: 6.9 10*3/uL (ref 4.0–10.5)
nRBC: 0 % (ref 0.0–0.2)

## 2022-05-05 LAB — CMP (CANCER CENTER ONLY)
ALT: 24 U/L (ref 0–44)
AST: 28 U/L (ref 15–41)
Albumin: 4.6 g/dL (ref 3.5–5.0)
Alkaline Phosphatase: 60 U/L (ref 38–126)
Anion gap: 11 (ref 5–15)
BUN: 15 mg/dL (ref 8–23)
CO2: 24 mmol/L (ref 22–32)
Calcium: 9.6 mg/dL (ref 8.9–10.3)
Chloride: 98 mmol/L (ref 98–111)
Creatinine: 0.96 mg/dL (ref 0.61–1.24)
GFR, Estimated: >60 mL/min (ref >60)
Glucose, Bld: 125 mg/dL — ABNORMAL HIGH (ref 70–99)
Potassium: 3.9 mmol/L (ref 3.5–5.1)
Sodium: 133 mmol/L — ABNORMAL LOW (ref 135–145)
Total Bilirubin: 0.9 mg/dL (ref 0.3–1.2)
Total Protein: 8.3 g/dL — ABNORMAL HIGH (ref 6.5–8.1)

## 2022-05-05 LAB — PSA: Prostatic Specific Antigen: 1.48 ng/mL (ref 0.00–4.00)

## 2022-05-05 MED ORDER — LEUPROLIDE ACETATE (6 MONTH) 45 MG ~~LOC~~ KIT
45.0000 mg | PACK | Freq: Once | SUBCUTANEOUS | Status: AC
  Administered 2022-05-05: 45 mg via SUBCUTANEOUS
  Filled 2022-05-05: qty 45

## 2022-05-05 NOTE — Progress Notes (Signed)
[place]  Telephone:(336) [phone] Fax:(336) [phone]  ID: Daniel Ryan OB: 11-26-1945  MR#: [id number]  [id number]  Patient Care Team: Dion Body, MD as PCP - General (Family Medicine) [name], MD as Consulting Physician (Oncology)   CHIEF COMPLAINT: Prostate cancer/high-grade PIN  INTERVAL HISTORY: Patient returns to clinic today for repeat laboratory, further evaluation, and initiation of Eligard.  He continues to feel well and remains asymptomatic although does admit to fatigue.  Despite this, he continues to remain active and play golf regularly.  He denies any pain.  He has no neurologic complaints.  He has a good appetite and denies weight loss.  He denies any chest pain, shortness of breath, cough, or hemoptysis.  He denies any nausea, vomiting, constipation, or diarrhea.  He has chronic urinary frequency, but no other complaints.  Patient offers no further specific complaints today.  REVIEW OF SYSTEMS:   Review of Systems  Constitutional:  Positive for malaise/fatigue. Negative for fever and weight loss.  Respiratory: Negative.  Negative for cough and shortness of breath.   Cardiovascular: Negative.  Negative for chest pain and leg swelling.  Gastrointestinal: Negative.  Negative for abdominal pain.  Genitourinary:  Positive for frequency. Negative for dysuria and hematuria.  Musculoskeletal: Negative.  Negative for back pain.  Skin: Negative.  Negative for rash.  Neurological: Negative.  Negative for sensory change, focal weakness, weakness and headaches.  Psychiatric/Behavioral: Negative.  The patient is not nervous/anxious.     As per HPI. Otherwise, a complete review of systems is negative.  PAST MEDICAL HISTORY: Past Medical History:  Diagnosis Date   Borderline diabetes    Prostate cancer ([place]) 07/2017    PAST SURGICAL HISTORY: Past Surgical History:  Procedure Laterality Date   COLONOSCOPY     COLONOSCOPY  WITH PROPOFOL N/A 09/13/2016   Procedure: COLONOSCOPY WITH PROPOFOL;  Surgeon: Lollie Sails, MD;  Location: [facility] ENDOSCOPY;  Service: Endoscopy;  Laterality: N/A;   COLONOSCOPY WITH PROPOFOL N/A 11/23/2018   Procedure: COLONOSCOPY WITH PROPOFOL;  Surgeon: Robert Bellow, MD;  Location: [facility] ENDOSCOPY;  Service: Endoscopy;  Laterality: N/A;   FLEXIBLE SIGMOIDOSCOPY     prostate biopsies     SKIN CANCER EXCISION     dr dasher    FAMILY HISTORY: Family History  Problem Relation Age of Onset   Hypertension Mother    Heart attack Father    Lung cancer Father    Diabetes Brother    Bladder Cancer Neg Hx    Kidney cancer Neg Hx    Prostate cancer Neg Hx     ADVANCED DIRECTIVES (Y/N):  N  HEALTH MAINTENANCE: Social History   Tobacco Use   Smoking status: Former    Packs/day: 2.00    Years: 20.00    Additional pack years: 0.00    Total pack years: 40.00    Types: Cigarettes    Quit date: 10/12/1981    Years since quitting: 40.5   Smokeless tobacco: Never  Vaping Use   Vaping Use: Never used  Substance Use Topics   Alcohol use: Yes    Alcohol/week: 1.0 - 6.0 standard drink of alcohol    Types: 1 - 6 Cans of beer per week    Comment: 1-6 per day -varies   Drug use: No     Colonoscopy:  PAP:  Bone density:  Lipid panel:  No Known Allergies  Current Outpatient Medications  Medication Sig Dispense Refill   ibuprofen (ADVIL) 200 MG tablet  Take by mouth.     metFORMIN (GLUCOPHAGE-XR) 500 MG 24 hr tablet SMARTSIG:1 Tablet(s) By Mouth Every Evening     ONETOUCH ULTRA test strip CHECK BLOOD SUGAR ONCE DAILY FASTING WITH GOAL LESS THAN 150. ONE TOUCH DX E11.69.     traZODone (DESYREL) 50 MG tablet Take 50 mg by mouth at bedtime.     losartan (COZAAR) 25 MG tablet Take 1 tablet by mouth daily.     pravastatin (PRAVACHOL) 20 MG tablet Take by mouth.     No current facility-administered medications for this visit.    OBJECTIVE: Vitals:   05/05/22 1029  BP: (!)  164/68  Pulse: 63  Resp: 16  Temp: (!) 96.6 F (35.9 C)  SpO2: 100%     Body mass index is 33 kg/m.    ECOG FS:0 - Asymptomatic   General: Well-developed, well-nourished, no acute distress. Eyes: Pink conjunctiva, anicteric sclera. HEENT: Normocephalic, moist mucous membranes. Lungs: No audible wheezing or coughing. Heart: Regular rate and rhythm. Abdomen: Soft, nontender, no obvious distention. Musculoskeletal: No edema, cyanosis, or clubbing. Neuro: Alert, answering all questions appropriately. Cranial nerves grossly intact. Skin: No rashes or petechiae noted. Psych: Normal affect.  LAB RESULTS:  Lab Results  Component Value Date   NA 133 (L) 05/05/2022   K 3.9 05/05/2022   CL 98 05/05/2022   CO2 24 05/05/2022   GLUCOSE 125 (H) 05/05/2022   BUN 15 05/05/2022   CREATININE 0.96 05/05/2022   CALCIUM 9.6 05/05/2022   PROT 8.3 (H) 05/05/2022   ALBUMIN 4.6 05/05/2022   AST 28 05/05/2022   ALT 24 05/05/2022   ALKPHOS 60 05/05/2022   BILITOT 0.9 05/05/2022   GFRNONAA >60 05/05/2022    Lab Results  Component Value Date   WBC 6.9 05/05/2022   NEUTROABS 3.6 05/05/2022   HGB 16.1 05/05/2022   HCT 47.6 05/05/2022   MCV 90.3 05/05/2022   PLT 238 05/05/2022     STUDIES: No results found.  ONCOLOGY HISTORY: Patient has had multiple prostate biopsies in October 2009, December 2010, and February 2012.  All biopsies returned benign pathology.  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 Pathology was sent to [facility] for second opinion who concurred with the diagnosis. Prostate MRI in March 2016 revealed no findings suggestive of malignancy.  Patient has declined any further biopsies.  ASSESSMENT: Prostate cancer/high-grade PIN  PLAN:    Prostate cancer/high-grade PIN: Previously, patient's PSA was elevated, but stable ranging between 15 and 27.06 since August 2019.  More recently his PSA increased to 72.37.  PET scan results from December 06, 2021  reviewed independently with significant hypermetabolism in patient's prostate, but no other evidence of disease.  We discussed the possibility of XRT or seed implantation for treatment which patient has declined and he wishes to pursue monthly Firmagon and then transition to Eligard every 6 months.  Patient also declined surgical intervention.  Patient's most recent PSA continues to improve and is now 1.3.  He has received 4 doses of Firmagon and now will receive Eligard every 6 months.  Proceed with treatment today.  Return to clinic in 6 months with repeat laboratory, further evaluation, and continuation of treatment.  Frequency: Chronic and unchanged.  Follow-up with urology as needed. Fatigue: Chronic.  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  No intervention needed. Hypertension: Chronic and unchanged.  Continue monitoring and treatment per primary care. Hyponatremia: Mild, monitor.   Patient expressed understanding and was in agreement with this  plan. He also understands that He can call clinic at any time with any questions, concerns, or complaints.    [name], MD   05/05/2022 12:50 PM

## 2022-11-05 ENCOUNTER — Inpatient Hospital Stay: Payer: Medicare Other | Attending: Oncology

## 2022-11-05 ENCOUNTER — Inpatient Hospital Stay (HOSPITAL_BASED_OUTPATIENT_CLINIC_OR_DEPARTMENT_OTHER): Payer: Medicare Other | Admitting: Oncology

## 2022-11-05 ENCOUNTER — Encounter: Payer: Self-pay | Admitting: Oncology

## 2022-11-05 ENCOUNTER — Inpatient Hospital Stay: Payer: Medicare Other

## 2022-11-05 VITALS — BP 151/70 | HR 85 | Temp 98.1°F | Resp 16 | Ht 70.0 in | Wt 234.0 lb

## 2022-11-05 DIAGNOSIS — Z79899 Other long term (current) drug therapy: Secondary | ICD-10-CM | POA: Insufficient documentation

## 2022-11-05 DIAGNOSIS — Z87891 Personal history of nicotine dependence: Secondary | ICD-10-CM | POA: Diagnosis not present

## 2022-11-05 DIAGNOSIS — R350 Frequency of micturition: Secondary | ICD-10-CM | POA: Diagnosis not present

## 2022-11-05 DIAGNOSIS — C61 Malignant neoplasm of prostate: Secondary | ICD-10-CM

## 2022-11-05 DIAGNOSIS — Z85828 Personal history of other malignant neoplasm of skin: Secondary | ICD-10-CM | POA: Insufficient documentation

## 2022-11-05 DIAGNOSIS — Z801 Family history of malignant neoplasm of trachea, bronchus and lung: Secondary | ICD-10-CM | POA: Diagnosis not present

## 2022-11-05 DIAGNOSIS — Z833 Family history of diabetes mellitus: Secondary | ICD-10-CM | POA: Diagnosis not present

## 2022-11-05 DIAGNOSIS — R232 Flushing: Secondary | ICD-10-CM | POA: Diagnosis not present

## 2022-11-05 DIAGNOSIS — R5383 Other fatigue: Secondary | ICD-10-CM | POA: Insufficient documentation

## 2022-11-05 DIAGNOSIS — Z8249 Family history of ischemic heart disease and other diseases of the circulatory system: Secondary | ICD-10-CM | POA: Insufficient documentation

## 2022-11-05 LAB — CMP (CANCER CENTER ONLY)
ALT: 24 U/L (ref 0–44)
AST: 22 U/L (ref 15–41)
Albumin: 3.8 g/dL (ref 3.5–5.0)
Alkaline Phosphatase: 62 U/L (ref 38–126)
Anion gap: 9 (ref 5–15)
BUN: 16 mg/dL (ref 8–23)
CO2: 24 mmol/L (ref 22–32)
Calcium: 9.1 mg/dL (ref 8.9–10.3)
Chloride: 102 mmol/L (ref 98–111)
Creatinine: 1.17 mg/dL (ref 0.61–1.24)
GFR, Estimated: >60 mL/min (ref >60)
Glucose, Bld: 271 mg/dL — ABNORMAL HIGH (ref 70–99)
Potassium: 4.1 mmol/L (ref 3.5–5.1)
Sodium: 135 mmol/L (ref 135–145)
Total Bilirubin: 0.8 mg/dL (ref 0.3–1.2)
Total Protein: 6.8 g/dL (ref 6.5–8.1)

## 2022-11-05 LAB — CBC WITH DIFFERENTIAL (CANCER CENTER ONLY)
Abs Immature Granulocytes: 0.03 10*3/uL (ref 0.00–0.07)
Basophils Absolute: 0 10*3/uL (ref 0.0–0.1)
Basophils Relative: 1 %
Eosinophils Absolute: 0.1 10*3/uL (ref 0.0–0.5)
Eosinophils Relative: 2 %
HCT: 42.4 % (ref 39.0–52.0)
Hemoglobin: 14.3 g/dL (ref 13.0–17.0)
Immature Granulocytes: 1 %
Lymphocytes Relative: 39 %
Lymphs Abs: 2.3 10*3/uL (ref 0.7–4.0)
MCH: 30.2 pg (ref 26.0–34.0)
MCHC: 33.7 g/dL (ref 30.0–36.0)
MCV: 89.5 fL (ref 80.0–100.0)
Monocytes Absolute: 0.3 10*3/uL (ref 0.1–1.0)
Monocytes Relative: 6 %
Neutro Abs: 3 10*3/uL (ref 1.7–7.7)
Neutrophils Relative %: 51 %
Platelet Count: 206 10*3/uL (ref 150–400)
RBC: 4.74 MIL/uL (ref 4.22–5.81)
RDW: 12.5 % (ref 11.5–15.5)
WBC Count: 5.8 10*3/uL (ref 4.0–10.5)
nRBC: 0 % (ref 0.0–0.2)

## 2022-11-05 LAB — PSA: Prostatic Specific Antigen: 0.49 ng/mL (ref 0.00–4.00)

## 2022-11-05 MED ORDER — LEUPROLIDE ACETATE (6 MONTH) 45 MG ~~LOC~~ KIT
45.0000 mg | PACK | Freq: Once | SUBCUTANEOUS | Status: AC
  Administered 2022-11-05: 45 mg via SUBCUTANEOUS
  Filled 2022-11-05: qty 45

## 2022-11-05 NOTE — Progress Notes (Signed)
[facility]  Telephone:(336) [phone] Fax:(336) [phone]  ID: Daniel Ryan OB: 02-11-1946  MR#: [id number]  [id number]#:[id number]  Patient Care Team: Marisue Ivan, MD as PCP - General (Family Medicine) Jeralyn Ruths, MD as Consulting Physician (Oncology)   CHIEF COMPLAINT: Prostate cancer/high-grade PIN  INTERVAL HISTORY: Patient returns to clinic today for repeat laboratory work, further evaluation, and continuation of Eligard.  He has fatigue and hot flashes, but these are chronic and unchanged and do not affect his day-to-day activity.  He continues to remain active and plays golf regularly. He denies any pain.  He has no neurologic complaints.  He has a good appetite and denies weight loss.  He denies any chest pain, shortness of breath, cough, or hemoptysis.  He denies any nausea, vomiting, constipation, or diarrhea.  He has chronic urinary frequency, but no other complaints.  Patient offers no further specific complaints today.  REVIEW OF SYSTEMS:   Review of Systems  Constitutional:  Positive for malaise/fatigue. Negative for fever and weight loss.  Respiratory: Negative.  Negative for cough and shortness of breath.   Cardiovascular: Negative.  Negative for chest pain and leg swelling.  Gastrointestinal: Negative.  Negative for abdominal pain.  Genitourinary:  Positive for frequency. Negative for dysuria and hematuria.  Musculoskeletal: Negative.  Negative for back pain.  Skin: Negative.  Negative for rash.  Neurological:  Positive for sensory change. Negative for focal weakness, weakness and headaches.  Psychiatric/Behavioral: Negative.  The patient is not nervous/anxious.     As per HPI. Otherwise, a complete review of systems is negative.  PAST MEDICAL HISTORY: Past Medical History:  Diagnosis Date   Borderline diabetes    Prostate cancer (HCC) 07/2017    PAST SURGICAL HISTORY: Past Surgical History:  Procedure Laterality Date    COLONOSCOPY     COLONOSCOPY WITH PROPOFOL N/A 09/13/2016   Procedure: COLONOSCOPY WITH PROPOFOL;  Surgeon: Christena Deem, MD;  Location: [facility] ENDOSCOPY;  Service: Endoscopy;  Laterality: N/A;   COLONOSCOPY WITH PROPOFOL N/A 11/23/2018   Procedure: COLONOSCOPY WITH PROPOFOL;  Surgeon: Earline Mayotte, MD;  Location: [facility] ENDOSCOPY;  Service: Endoscopy;  Laterality: N/A;   FLEXIBLE SIGMOIDOSCOPY     prostate biopsies     SKIN CANCER EXCISION     dr dasher    FAMILY HISTORY: Family History  Problem Relation Age of Onset   Hypertension Mother    Heart attack Father    Lung cancer Father    Diabetes Brother    Bladder Cancer Neg Hx    Kidney cancer Neg Hx    Prostate cancer Neg Hx     ADVANCED DIRECTIVES (Y/N):  N  HEALTH MAINTENANCE: Social History   Tobacco Use   Smoking status: Former    Current packs/day: 0.00    Average packs/day: 2.0 packs/day for 20.0 years (40.0 ttl pk-yrs)    Types: Cigarettes    Start date: 10/12/1961    Quit date: 10/12/1981    Years since quitting: 41.0   Smokeless tobacco: Never  Vaping Use   Vaping status: Never Used  Substance Use Topics   Alcohol use: Yes    Alcohol/week: 1.0 - 6.0 standard drink of alcohol    Types: 1 - 6 Cans of beer per week    Comment: 1-6 per day -varies   Drug use: No     Colonoscopy:  PAP:  Bone density:  Lipid panel:  No Known Allergies  Current Outpatient Medications  Medication Sig Dispense Refill  ibuprofen (ADVIL) 200 MG tablet Take by mouth.     losartan (COZAAR) 25 MG tablet Take 1 tablet by mouth daily.     metFORMIN (GLUCOPHAGE-XR) 500 MG 24 hr tablet SMARTSIG:1 Tablet(s) By Mouth Every Evening     ONETOUCH ULTRA test strip CHECK BLOOD SUGAR ONCE DAILY FASTING WITH GOAL LESS THAN 150. ONE TOUCH DX E11.69.     pravastatin (PRAVACHOL) 20 MG tablet Take by mouth.     traZODone (DESYREL) 50 MG tablet Take 50 mg by mouth at bedtime.     No current facility-administered medications for this  visit.    OBJECTIVE: Vitals:   11/05/22 0959  BP: (!) 151/70  Pulse: 85  Resp: 16  Temp: 98.1 F (36.7 C)  SpO2: 98%     Body mass index is 33.58 kg/m.    ECOG FS:0 - Asymptomatic   General: Well-developed, well-nourished, no acute distress. Eyes: Pink conjunctiva, anicteric sclera. HEENT: Normocephalic, moist mucous membranes. Lungs: No audible wheezing or coughing. Heart: Regular rate and rhythm. Abdomen: Soft, nontender, no obvious distention. Musculoskeletal: No edema, cyanosis, or clubbing. Neuro: Alert, answering all questions appropriately. Cranial nerves grossly intact. Skin: No rashes or petechiae noted. Psych: Normal affect.   LAB RESULTS:  Lab Results  Component Value Date   NA 135 11/05/2022   K 4.1 11/05/2022   CL 102 11/05/2022   CO2 24 11/05/2022   GLUCOSE 271 (H) 11/05/2022   BUN 16 11/05/2022   CREATININE 1.17 11/05/2022   CALCIUM 9.1 11/05/2022   PROT 6.8 11/05/2022   ALBUMIN 3.8 11/05/2022   AST 22 11/05/2022   ALT 24 11/05/2022   ALKPHOS 62 11/05/2022   BILITOT 0.8 11/05/2022   GFRNONAA >60 11/05/2022    Lab Results  Component Value Date   WBC 5.8 11/05/2022   NEUTROABS 3.0 11/05/2022   HGB 14.3 11/05/2022   HCT 42.4 11/05/2022   MCV 89.5 11/05/2022   PLT 206 11/05/2022     STUDIES: No results found.  ONCOLOGY HISTORY: Patient has had multiple prostate biopsies in October 2009, December 2010, and February 2012.  All biopsies returned benign pathology.  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 Pathology was sent to [facility] for second opinion who concurred with the diagnosis. Prostate MRI in March 2016 revealed no findings suggestive of malignancy.  Patient has declined any further biopsies.  ASSESSMENT: Prostate cancer/high-grade PIN  PLAN:    Prostate cancer/high-grade PIN: Previously, patient's PSA was elevated, but stable ranging between 15 and 27.06 since August 2019.  More recently his PSA increased to  72.37.  PET scan results from December 06, 2021 reviewed independently with significant hypermetabolism in patient's prostate, but no other evidence of disease.  We discussed the possibility of XRT or seed implantation for treatment which patient has declined.  He also declined surgical intervention.  Patient initially received monthly Firmagon, but now has been transitioned to Eligard every 6 months.  His most recent PSA has trended down to 1.48.  Today's result is pending.  Proceed with treatment today.  Return to clinic in 6 months with repeat laboratory work, further evaluation, and continuation of treatment.   Frequency: Chronic and unchanged.  Follow-up with urology as needed. Fatigue: Chronic and unchanged.  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  No intervention needed. Hypertension: Chronic and unchanged.  Continue monitoring and treatment per primary care. Hyponatremia: Resolved.  I spent a total of 30 minutes reviewing chart data, face-to-face evaluation with the patient, counseling  and coordination of care as detailed above.    Patient expressed understanding and was in agreement with this plan. He also understands that He can call clinic at any time with any questions, concerns, or complaints.    Jeralyn Ruths, MD   11/05/2022 10:55 AM

## 2023-04-28 ENCOUNTER — Other Ambulatory Visit: Payer: Medicare Other

## 2023-04-29 ENCOUNTER — Ambulatory Visit: Payer: Self-pay | Admitting: Urology

## 2023-05-05 ENCOUNTER — Ambulatory Visit: Payer: Medicare Other

## 2023-05-05 ENCOUNTER — Other Ambulatory Visit: Payer: Medicare Other

## 2023-05-05 ENCOUNTER — Ambulatory Visit: Payer: Medicare Other | Admitting: Oncology

## 2023-05-05 ENCOUNTER — Other Ambulatory Visit: Payer: Self-pay | Admitting: *Deleted

## 2023-05-05 DIAGNOSIS — C61 Malignant neoplasm of prostate: Secondary | ICD-10-CM

## 2023-05-06 ENCOUNTER — Encounter: Payer: Self-pay | Admitting: Oncology

## 2023-05-06 ENCOUNTER — Inpatient Hospital Stay: Payer: Medicare Other | Attending: Oncology

## 2023-05-06 ENCOUNTER — Inpatient Hospital Stay: Payer: Medicare Other

## 2023-05-06 ENCOUNTER — Inpatient Hospital Stay: Payer: Medicare Other | Admitting: Oncology

## 2023-05-06 VITALS — BP 143/73 | HR 69 | Temp 97.7°F | Resp 18 | Ht 70.0 in | Wt 236.0 lb

## 2023-05-06 DIAGNOSIS — R5383 Other fatigue: Secondary | ICD-10-CM | POA: Diagnosis not present

## 2023-05-06 DIAGNOSIS — Z801 Family history of malignant neoplasm of trachea, bronchus and lung: Secondary | ICD-10-CM | POA: Diagnosis not present

## 2023-05-06 DIAGNOSIS — Z85828 Personal history of other malignant neoplasm of skin: Secondary | ICD-10-CM | POA: Insufficient documentation

## 2023-05-06 DIAGNOSIS — R350 Frequency of micturition: Secondary | ICD-10-CM | POA: Diagnosis not present

## 2023-05-06 DIAGNOSIS — Z79899 Other long term (current) drug therapy: Secondary | ICD-10-CM | POA: Insufficient documentation

## 2023-05-06 DIAGNOSIS — C61 Malignant neoplasm of prostate: Secondary | ICD-10-CM | POA: Insufficient documentation

## 2023-05-06 DIAGNOSIS — Z833 Family history of diabetes mellitus: Secondary | ICD-10-CM | POA: Insufficient documentation

## 2023-05-06 DIAGNOSIS — Z87891 Personal history of nicotine dependence: Secondary | ICD-10-CM | POA: Diagnosis not present

## 2023-05-06 DIAGNOSIS — I10 Essential (primary) hypertension: Secondary | ICD-10-CM | POA: Diagnosis not present

## 2023-05-06 DIAGNOSIS — Z8249 Family history of ischemic heart disease and other diseases of the circulatory system: Secondary | ICD-10-CM | POA: Insufficient documentation

## 2023-05-06 LAB — CBC WITH DIFFERENTIAL/PLATELET
Abs Immature Granulocytes: 0.03 10*3/uL (ref 0.00–0.07)
Basophils Absolute: 0 10*3/uL (ref 0.0–0.1)
Basophils Relative: 1 %
Eosinophils Absolute: 0.1 10*3/uL (ref 0.0–0.5)
Eosinophils Relative: 2 %
HCT: 40 % (ref 39.0–52.0)
Hemoglobin: 13.4 g/dL (ref 13.0–17.0)
Immature Granulocytes: 1 %
Lymphocytes Relative: 39 %
Lymphs Abs: 2.2 10*3/uL (ref 0.7–4.0)
MCH: 30.1 pg (ref 26.0–34.0)
MCHC: 33.5 g/dL (ref 30.0–36.0)
MCV: 89.9 fL (ref 80.0–100.0)
Monocytes Absolute: 0.4 10*3/uL (ref 0.1–1.0)
Monocytes Relative: 7 %
Neutro Abs: 2.8 10*3/uL (ref 1.7–7.7)
Neutrophils Relative %: 50 %
Platelets: 218 10*3/uL (ref 150–400)
RBC: 4.45 MIL/uL (ref 4.22–5.81)
RDW: 12.9 % (ref 11.5–15.5)
WBC: 5.6 10*3/uL (ref 4.0–10.5)
nRBC: 0 % (ref 0.0–0.2)

## 2023-05-06 LAB — CMP (CANCER CENTER ONLY)
ALT: 27 U/L (ref 0–44)
AST: 22 U/L (ref 15–41)
Albumin: 3.9 g/dL (ref 3.5–5.0)
Alkaline Phosphatase: 55 U/L (ref 38–126)
Anion gap: 8 (ref 5–15)
BUN: 18 mg/dL (ref 8–23)
CO2: 26 mmol/L (ref 22–32)
Calcium: 9.3 mg/dL (ref 8.9–10.3)
Chloride: 103 mmol/L (ref 98–111)
Creatinine: 1.19 mg/dL (ref 0.61–1.24)
GFR, Estimated: >60 mL/min (ref >60)
Glucose, Bld: 185 mg/dL — ABNORMAL HIGH (ref 70–99)
Potassium: 4.2 mmol/L (ref 3.5–5.1)
Sodium: 137 mmol/L (ref 135–145)
Total Bilirubin: 0.6 mg/dL (ref 0.0–1.2)
Total Protein: 6.9 g/dL (ref 6.5–8.1)

## 2023-05-06 NOTE — Progress Notes (Signed)
 [facility]  Telephone:(336) [phone] Fax:(336) [phone]  ID: Daniel Ryan OB: 04/30/1945  MR#: [id number]  WUX#:[id number]  Patient Care Team: Marisue Ivan, MD as PCP - General (Family Medicine) Jeralyn Ruths, MD as Consulting Physician (Oncology)   CHIEF COMPLAINT: Prostate cancer/high-grade PIN  INTERVAL HISTORY: Patient returns to clinic today for repeat laboratory, further evaluation, and consideration of Eligard.  He states after his last injection 6 months ago his fatigue was much more significant and only felt back to his normal self approximately 3 to 4 weeks ago.  He otherwise feels well.  He denies any pain.  He has no neurologic complaints.  He has a good appetite and denies weight loss.  He denies any chest pain, shortness of breath, cough, or hemoptysis.  He denies any nausea, vomiting, constipation, or diarrhea.  He has chronic urinary frequency, but no other complaints.  Patient offers no further specific complaints today.  REVIEW OF SYSTEMS:   Review of Systems  Constitutional:  Positive for malaise/fatigue. Negative for fever and weight loss.  Respiratory: Negative.  Negative for cough and shortness of breath.   Cardiovascular: Negative.  Negative for chest pain and leg swelling.  Gastrointestinal: Negative.  Negative for abdominal pain.  Genitourinary:  Positive for frequency. Negative for dysuria and hematuria.  Musculoskeletal: Negative.  Negative for back pain.  Skin: Negative.  Negative for rash.  Neurological:  Positive for sensory change. Negative for focal weakness, weakness and headaches.  Psychiatric/Behavioral: Negative.  The patient is not nervous/anxious.     As per HPI. Otherwise, a complete review of systems is negative.  PAST MEDICAL HISTORY: Past Medical History:  Diagnosis Date   Borderline diabetes    Prostate cancer (HCC) 07/2017    PAST SURGICAL HISTORY: Past Surgical History:  Procedure Laterality  Date   COLONOSCOPY     COLONOSCOPY WITH PROPOFOL N/A 09/13/2016   Procedure: COLONOSCOPY WITH PROPOFOL;  Surgeon: Christena Deem, MD;  Location: [facility] ENDOSCOPY;  Service: Endoscopy;  Laterality: N/A;   COLONOSCOPY WITH PROPOFOL N/A 11/23/2018   Procedure: COLONOSCOPY WITH PROPOFOL;  Surgeon: Earline Mayotte, MD;  Location: [facility] ENDOSCOPY;  Service: Endoscopy;  Laterality: N/A;   FLEXIBLE SIGMOIDOSCOPY     prostate biopsies     SKIN CANCER EXCISION     dr dasher    FAMILY HISTORY: Family History  Problem Relation Age of Onset   Hypertension Mother    Heart attack Father    Lung cancer Father    Diabetes Brother    Bladder Cancer Neg Hx    Kidney cancer Neg Hx    Prostate cancer Neg Hx     ADVANCED DIRECTIVES (Y/N):  N  HEALTH MAINTENANCE: Social History   Tobacco Use   Smoking status: Former    Current packs/day: 0.00    Average packs/day: 2.0 packs/day for 20.0 years (40.0 ttl pk-yrs)    Types: Cigarettes    Start date: 10/12/1961    Quit date: 10/12/1981    Years since quitting: 41.5   Smokeless tobacco: Never  Vaping Use   Vaping status: Never Used  Substance Use Topics   Alcohol use: Yes    Alcohol/week: 1.0 - 6.0 standard drink of alcohol    Types: 1 - 6 Cans of beer per week    Comment: 1-6 per day -varies   Drug use: No     Colonoscopy:  PAP:  Bone density:  Lipid panel:  No Known Allergies  Current Outpatient Medications  Medication Sig Dispense Refill   ibuprofen (ADVIL) 200 MG tablet Take by mouth.     losartan (COZAAR) 25 MG tablet Take 1 tablet by mouth daily.     metFORMIN (GLUCOPHAGE-XR) 500 MG 24 hr tablet SMARTSIG:1 Tablet(s) By Mouth Every Evening     ONETOUCH ULTRA test strip CHECK BLOOD SUGAR ONCE DAILY FASTING WITH GOAL LESS THAN 150. ONE TOUCH DX E11.69.     pravastatin (PRAVACHOL) 20 MG tablet Take by mouth.     traZODone (DESYREL) 50 MG tablet Take 50 mg by mouth at bedtime.     No current facility-administered medications for  this visit.    OBJECTIVE: Vitals:   05/06/23 1001  BP: (!) 143/73  Pulse: 69  Resp: 18  Temp: 97.7 F (36.5 C)  SpO2: 98%     Body mass index is 33.86 kg/m.    ECOG FS:0 - Asymptomatic   General: Well-developed, well-nourished, no acute distress. Eyes: Pink conjunctiva, anicteric sclera. HEENT: Normocephalic, moist mucous membranes. Lungs: No audible wheezing or coughing. Heart: Regular rate and rhythm. Abdomen: Soft, nontender, no obvious distention. Musculoskeletal: No edema, cyanosis, or clubbing. Neuro: Alert, answering all questions appropriately. Cranial nerves grossly intact. Skin: No rashes or petechiae noted. Psych: Normal affect.   LAB RESULTS:  Lab Results  Component Value Date   NA 135 11/05/2022   K 4.1 11/05/2022   CL 102 11/05/2022   CO2 24 11/05/2022   GLUCOSE 271 (H) 11/05/2022   BUN 16 11/05/2022   CREATININE 1.17 11/05/2022   CALCIUM 9.1 11/05/2022   PROT 6.8 11/05/2022   ALBUMIN 3.8 11/05/2022   AST 22 11/05/2022   ALT 24 11/05/2022   ALKPHOS 62 11/05/2022   BILITOT 0.8 11/05/2022   GFRNONAA >60 11/05/2022    Lab Results  Component Value Date   WBC 5.6 05/06/2023   NEUTROABS 2.8 05/06/2023   HGB 13.4 05/06/2023   HCT 40.0 05/06/2023   MCV 89.9 05/06/2023   PLT 218 05/06/2023     STUDIES: No results found.  ONCOLOGY HISTORY: Patient has had multiple prostate biopsies in October 2009, December 2010, and February 2012.  All biopsies returned benign pathology.  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 Pathology was sent to [facility] for second opinion who concurred with the diagnosis. Prostate MRI in March 2016 revealed no findings suggestive of malignancy.  Patient has declined any further biopsies.  ASSESSMENT: Prostate cancer/high-grade PIN  PLAN:    Prostate cancer/high-grade PIN: Previously, patient's PSA was elevated, but stable ranging between 15 and 27.06 since August 2019.  More recently his PSA increased  to 72.37.  PET scan results from December 06, 2021 reviewed independently with significant hypermetabolism in patient's prostate, but no other evidence of disease.  We discussed the possibility of XRT or seed implantation for treatment which patient has declined.  He also declined surgical intervention.  Patient initially received monthly Firmagon, but now has been transitioned to Eligard every 6 months.  His PSA has trended down to 0.49.  Today's result is pending.  He adamantly refuses additional Eligard today given his extreme fatigue with his last injection.  Patient states he would reconsider doing monthly Firmagon or possibly transitioning to a smaller dose Eligard every 3 months.  No intervention is needed at this time.  Return to clinic in 6 months with repeat laboratory work, further evaluation, and consideration of reinitiating treatment.   Frequency: Chronic and unchanged.  Patient reports he has follow-up with urology in May.  Fatigue: Chronic and unchanged.  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  Discontinue Eligard as above.   Hypertension: Mild.  Continue monitoring and treatment per primary care.   Hyperglycemia: Chronic and unchanged.  Continue monitoring and treatment per primary care.   Patient expressed understanding and was in agreement with this plan. He also understands that He can call clinic at any time with any questions, concerns, or complaints.    Jeralyn Ruths, MD   05/06/2023 10:20 AM

## 2023-05-06 NOTE — Progress Notes (Signed)
 Patient is refusing the eligard shot today. States the last time he received it he had no energy at all and felt terrible until about 3 weeks ago. He states he doesn't want to take it ever again if it is going to make him feel that bad.

## 2023-05-07 LAB — PSA: Prostatic Specific Antigen: 0.3 ng/mL (ref 0.00–4.00)

## 2023-07-22 ENCOUNTER — Other Ambulatory Visit

## 2023-07-22 DIAGNOSIS — C61 Malignant neoplasm of prostate: Secondary | ICD-10-CM

## 2023-07-23 LAB — PSA: Prostate Specific Ag, Serum: 0.3 ng/mL (ref 0.0–4.0)

## 2023-07-29 ENCOUNTER — Ambulatory Visit: Admitting: Urology

## 2023-08-26 ENCOUNTER — Ambulatory Visit: Admitting: Urology

## 2023-11-09 ENCOUNTER — Other Ambulatory Visit: Payer: Self-pay | Admitting: *Deleted

## 2023-11-09 DIAGNOSIS — C61 Malignant neoplasm of prostate: Secondary | ICD-10-CM

## 2023-11-10 ENCOUNTER — Inpatient Hospital Stay: Attending: Oncology

## 2023-11-10 DIAGNOSIS — I10 Essential (primary) hypertension: Secondary | ICD-10-CM | POA: Diagnosis not present

## 2023-11-10 DIAGNOSIS — Z87891 Personal history of nicotine dependence: Secondary | ICD-10-CM | POA: Diagnosis not present

## 2023-11-10 DIAGNOSIS — R350 Frequency of micturition: Secondary | ICD-10-CM | POA: Insufficient documentation

## 2023-11-10 DIAGNOSIS — Z801 Family history of malignant neoplasm of trachea, bronchus and lung: Secondary | ICD-10-CM | POA: Diagnosis not present

## 2023-11-10 DIAGNOSIS — C61 Malignant neoplasm of prostate: Secondary | ICD-10-CM | POA: Insufficient documentation

## 2023-11-10 DIAGNOSIS — Z79899 Other long term (current) drug therapy: Secondary | ICD-10-CM | POA: Diagnosis not present

## 2023-11-10 LAB — CBC WITH DIFFERENTIAL (CANCER CENTER ONLY)
Abs Immature Granulocytes: 0.04 K/uL (ref 0.00–0.07)
Basophils Absolute: 0 K/uL (ref 0.0–0.1)
Basophils Relative: 1 %
Eosinophils Absolute: 0.1 K/uL (ref 0.0–0.5)
Eosinophils Relative: 2 %
HCT: 38.9 % — ABNORMAL LOW (ref 39.0–52.0)
Hemoglobin: 13.3 g/dL (ref 13.0–17.0)
Immature Granulocytes: 1 %
Lymphocytes Relative: 34 %
Lymphs Abs: 2.4 K/uL (ref 0.7–4.0)
MCH: 30.6 pg (ref 26.0–34.0)
MCHC: 34.2 g/dL (ref 30.0–36.0)
MCV: 89.4 fL (ref 80.0–100.0)
Monocytes Absolute: 0.4 K/uL (ref 0.1–1.0)
Monocytes Relative: 6 %
Neutro Abs: 4 K/uL (ref 1.7–7.7)
Neutrophils Relative %: 56 %
Platelet Count: 243 K/uL (ref 150–400)
RBC: 4.35 MIL/uL (ref 4.22–5.81)
RDW: 12.4 % (ref 11.5–15.5)
WBC Count: 7.1 K/uL (ref 4.0–10.5)
nRBC: 0 % (ref 0.0–0.2)

## 2023-11-10 LAB — CMP (CANCER CENTER ONLY)
ALT: 35 U/L (ref 0–44)
AST: 36 U/L (ref 15–41)
Albumin: 4.1 g/dL (ref 3.5–5.0)
Alkaline Phosphatase: 56 U/L (ref 38–126)
Anion gap: 8 (ref 5–15)
BUN: 20 mg/dL (ref 8–23)
CO2: 26 mmol/L (ref 22–32)
Calcium: 9.5 mg/dL (ref 8.9–10.3)
Chloride: 104 mmol/L (ref 98–111)
Creatinine: 1.12 mg/dL (ref 0.61–1.24)
GFR, Estimated: >60 mL/min (ref >60)
Glucose, Bld: 162 mg/dL — ABNORMAL HIGH (ref 70–99)
Potassium: 4.4 mmol/L (ref 3.5–5.1)
Sodium: 138 mmol/L (ref 135–145)
Total Bilirubin: 0.8 mg/dL (ref 0.0–1.2)
Total Protein: 7.1 g/dL (ref 6.5–8.1)

## 2023-11-10 LAB — PSA: Prostatic Specific Antigen: 0.49 ng/mL (ref 0.00–4.00)

## 2023-11-11 ENCOUNTER — Inpatient Hospital Stay: Admitting: Oncology

## 2023-11-11 ENCOUNTER — Encounter: Payer: Self-pay | Admitting: Oncology

## 2023-11-11 VITALS — BP 138/62 | HR 62 | Temp 97.6°F | Resp 18 | Ht 70.0 in | Wt 233.3 lb

## 2023-11-11 DIAGNOSIS — C61 Malignant neoplasm of prostate: Secondary | ICD-10-CM | POA: Diagnosis not present

## 2023-11-11 NOTE — Progress Notes (Signed)
 [facility]  Telephone:(336) [phone] Fax:(336) [phone]  ID: Manus Daniel Ryan OB: Apr 01, 1945  MR#: [id number]  [id number]#:[id number]  Patient Care Team: Alla Amis, MD as PCP - General (Family Medicine) Jacobo Evalene PARAS, MD as Consulting Physician (Oncology)   CHIEF COMPLAINT: Prostate cancer/high-grade PIN  INTERVAL HISTORY: Patient returns to clinic today for repeat laboratory and further evaluation.  He currently feels well and is asymptomatic.  He does not complain of any weakness or fatigue.  He has no neurologic complaints.  He has a good appetite and denies weight loss.  He denies any chest pain, shortness of breath, cough, or hemoptysis.  He denies any nausea, vomiting, constipation, or diarrhea.  He has chronic urinary frequency, but no other complaints.  Patient offers no further specific complaints today.  REVIEW OF SYSTEMS:   Review of Systems  Constitutional: Negative.  Negative for fever, malaise/fatigue and weight loss.  Respiratory: Negative.  Negative for cough and shortness of breath.   Cardiovascular: Negative.  Negative for chest pain and leg swelling.  Gastrointestinal: Negative.  Negative for abdominal pain.  Genitourinary:  Positive for frequency. Negative for dysuria and hematuria.  Musculoskeletal: Negative.  Negative for back pain.  Skin: Negative.  Negative for rash.  Neurological: Negative.  Negative for sensory change, focal weakness, weakness and headaches.  Psychiatric/Behavioral: Negative.  The patient is not nervous/anxious.     As per HPI. Otherwise, a complete review of systems is negative.  PAST MEDICAL HISTORY: Past Medical History:  Diagnosis Date   Borderline diabetes    Prostate cancer (HCC) 07/2017    PAST SURGICAL HISTORY: Past Surgical History:  Procedure Laterality Date   COLONOSCOPY     COLONOSCOPY WITH PROPOFOL  N/A 09/13/2016   Procedure: COLONOSCOPY WITH PROPOFOL ;  Surgeon: Gaylyn Gladis PENNER, MD;   Location: [facility] ENDOSCOPY;  Service: Endoscopy;  Laterality: N/A;   COLONOSCOPY WITH PROPOFOL  N/A 11/23/2018   Procedure: COLONOSCOPY WITH PROPOFOL ;  Surgeon: Dessa Reyes ORN, MD;  Location: [facility] ENDOSCOPY;  Service: Endoscopy;  Laterality: N/A;   FLEXIBLE SIGMOIDOSCOPY     prostate biopsies     SKIN CANCER EXCISION     dr dasher    FAMILY HISTORY: Family History  Problem Relation Age of Onset   Hypertension Mother    Heart attack Father    Lung cancer Father    Diabetes Brother    Bladder Cancer Neg Hx    Kidney cancer Neg Hx    Prostate cancer Neg Hx     ADVANCED DIRECTIVES (Y/N):  N  HEALTH MAINTENANCE: Social History   Tobacco Use   Smoking status: Former    Current packs/day: 0.00    Average packs/day: 2.0 packs/day for 20.0 years (40.0 ttl pk-yrs)    Types: Cigarettes    Start date: 10/12/1961    Quit date: 10/12/1981    Years since quitting: 42.1   Smokeless tobacco: Never  Vaping Use   Vaping status: Never Used  Substance Use Topics   Alcohol use: Yes    Alcohol/week: 1.0 - 6.0 standard drink of alcohol    Types: 1 - 6 Cans of beer per week    Comment: 1-6 per day -varies   Drug use: No     Colonoscopy:  PAP:  Bone density:  Lipid panel:  No Known Allergies  Current Outpatient Medications  Medication Sig Dispense Refill   ibuprofen (ADVIL) 200 MG tablet Take by mouth.     losartan (COZAAR) 25 MG tablet Take 1 tablet by  mouth daily.     metFORMIN (GLUCOPHAGE-XR) 500 MG 24 hr tablet SMARTSIG:1 Tablet(s) By Mouth Every Evening     ONETOUCH ULTRA test strip CHECK BLOOD SUGAR ONCE DAILY FASTING WITH GOAL LESS THAN 150. ONE TOUCH DX E11.69.     pravastatin (PRAVACHOL) 20 MG tablet Take by mouth.     traZODone (DESYREL) 50 MG tablet Take 50 mg by mouth at bedtime.     No current facility-administered medications for this visit.    OBJECTIVE: Vitals:   11/11/23 1021  BP: 138/62  Pulse: 62  Resp: 18  Temp: 97.6 F (36.4 C)  SpO2: 100%     Body  mass index is 33.48 kg/m.    ECOG FS:0 - Asymptomatic   General: Well-developed, well-nourished, no acute distress. Eyes: Pink conjunctiva, anicteric sclera. HEENT: Normocephalic, moist mucous membranes. Lungs: No audible wheezing or coughing. Heart: Regular rate and rhythm. Abdomen: Soft, nontender, no obvious distention. Musculoskeletal: No edema, cyanosis, or clubbing. Neuro: Alert, answering all questions appropriately. Cranial nerves grossly intact. Skin: No rashes or petechiae noted. Psych: Normal affect.  LAB RESULTS:  Lab Results  Component Value Date   NA 138 11/10/2023   K 4.4 11/10/2023   CL 104 11/10/2023   CO2 26 11/10/2023   GLUCOSE 162 (H) 11/10/2023   BUN 20 11/10/2023   CREATININE 1.12 11/10/2023   CALCIUM 9.5 11/10/2023   PROT 7.1 11/10/2023   ALBUMIN 4.1 11/10/2023   AST 36 11/10/2023   ALT 35 11/10/2023   ALKPHOS 56 11/10/2023   BILITOT 0.8 11/10/2023   GFRNONAA >60 11/10/2023    Lab Results  Component Value Date   WBC 7.1 11/10/2023   NEUTROABS 4.0 11/10/2023   HGB 13.3 11/10/2023   HCT 38.9 (L) 11/10/2023   MCV 89.4 11/10/2023   PLT 243 11/10/2023     STUDIES: No results found.  ONCOLOGY HISTORY: Patient has had multiple prostate biopsies in October 2009, December 2010, and February 2012.  All biopsies returned benign pathology.  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 Pathology was sent to [facility] for second opinion who concurred with the diagnosis. Prostate MRI in March 2016 revealed no findings suggestive of malignancy.  Patient has declined any further biopsies.  ASSESSMENT: Prostate cancer/high-grade PIN  PLAN:    Prostate cancer/high-grade PIN: Previously, patient's PSA was elevated, but stable ranging between 15 and 27.06 since August 2019.  His PSA then increased to as high as 72.37 on November 05, 2021.  PET scan results from December 06, 2021 reviewed independently with significant hypermetabolism in  patient's prostate, but no other evidence of disease.  Patient declined XRT, seed implantation, or surgical intervention at that time.  He initially received monthly Firmagon  which he tolerated well, but when he transitioned to Eligard  45 mg he reported significant weakness, fatigue, and hot flashes.  He only received 2 doses of Eligard  most recently on November 05, 2022 before discontinuing.  His PSA trended down to 0.3, but now has trended up slightly 0.49.  He continues to refuse any treatment at this time, but states he would reconsider monthly Firmagon  in the future if necessary.  No intervention is needed at this time.  Return to clinic in 6 months with repeat laboratory work and further evaluation.  Frequency: Chronic and unchanged.  Patient reports he has follow-up with urology in May.   Fatigue: Resolved since discontinuing Eligard . Hypertension: His blood pressure is within normal limits today.  Continue monitoring and treatment per primary  care.      Patient expressed understanding and was in agreement with this plan. He also understands that He can call clinic at any time with any questions, concerns, or complaints.    Evalene JINNY Reusing, MD   11/11/2023 10:35 AM

## 2024-05-10 ENCOUNTER — Other Ambulatory Visit

## 2024-05-11 ENCOUNTER — Ambulatory Visit: Admitting: Oncology

## 2024-05-24 ENCOUNTER — Inpatient Hospital Stay

## 2024-05-25 ENCOUNTER — Inpatient Hospital Stay: Admitting: Oncology
